# Patient Record
Sex: Female | Born: 1990 | Race: Black or African American | Hispanic: No | Marital: Single | Smoking: Current every day smoker
Health system: Southern US, Community
[De-identification: ages and names within clinical notes are randomized; demographics above are authoritative.]

## PROBLEM LIST (undated history)

## (undated) DIAGNOSIS — F315 Bipolar disorder, current episode depressed, severe, with psychotic features: Secondary | ICD-10-CM

## (undated) DIAGNOSIS — F209 Schizophrenia, unspecified: Secondary | ICD-10-CM

## (undated) DIAGNOSIS — E669 Obesity, unspecified: Secondary | ICD-10-CM

## (undated) DIAGNOSIS — J45909 Unspecified asthma, uncomplicated: Secondary | ICD-10-CM

## (undated) DIAGNOSIS — G4730 Sleep apnea, unspecified: Secondary | ICD-10-CM

## (undated) HISTORY — PX: TONSILLECTOMY: SUR1361

## (undated) HISTORY — PX: OTHER SURGICAL HISTORY: SHX169

---
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
## 2014-03-18 ENCOUNTER — Emergency Department (HOSPITAL_COMMUNITY): Payer: Self-pay

## 2014-03-18 ENCOUNTER — Emergency Department (HOSPITAL_COMMUNITY)
Admission: EM | Admit: 2014-03-18 | Discharge: 2014-03-18 | Disposition: A | Discharge status: Home / Self Care | Payer: Self-pay | Attending: Emergency Medicine | Admitting: Emergency Medicine

## 2014-03-18 ENCOUNTER — Encounter (HOSPITAL_COMMUNITY): Payer: Self-pay | Admitting: Emergency Medicine

## 2014-03-18 DIAGNOSIS — M25559 Pain in unspecified hip: Secondary | ICD-10-CM | POA: Insufficient documentation

## 2014-03-18 DIAGNOSIS — R1032 Left lower quadrant pain: Secondary | ICD-10-CM | POA: Insufficient documentation

## 2014-03-18 DIAGNOSIS — O039 Complete or unspecified spontaneous abortion without complication: Secondary | ICD-10-CM | POA: Insufficient documentation

## 2014-03-18 DIAGNOSIS — J45909 Unspecified asthma, uncomplicated: Secondary | ICD-10-CM | POA: Insufficient documentation

## 2014-03-18 HISTORY — DX: Unspecified asthma, uncomplicated: J45.909

## 2014-03-18 LAB — URINE MICROSCOPIC-ADD ON

## 2014-03-18 LAB — CBC WITH DIFFERENTIAL/PLATELET
BASOS PCT: 0 % (ref 0–1)
Basophils Absolute: 0 10*3/uL (ref 0.0–0.1)
EOS ABS: 0.2 10*3/uL (ref 0.0–0.7)
Eosinophils Relative: 2 % (ref 0–5)
HCT: 36.4 % (ref 36.0–46.0)
HEMOGLOBIN: 11.9 g/dL — AB (ref 12.0–15.0)
Lymphocytes Relative: 24 % (ref 12–46)
Lymphs Abs: 2.4 10*3/uL (ref 0.7–4.0)
MCH: 27.4 pg (ref 26.0–34.0)
MCHC: 32.7 g/dL (ref 30.0–36.0)
MCV: 83.7 fL (ref 78.0–100.0)
MONOS PCT: 6 % (ref 3–12)
Monocytes Absolute: 0.6 10*3/uL (ref 0.1–1.0)
NEUTROS ABS: 6.9 10*3/uL (ref 1.7–7.7)
NEUTROS PCT: 68 % (ref 43–77)
PLATELETS: 318 10*3/uL (ref 150–400)
RBC: 4.35 MIL/uL (ref 3.87–5.11)
RDW: 13.7 % (ref 11.5–15.5)
WBC: 10.2 10*3/uL (ref 4.0–10.5)

## 2014-03-18 LAB — URINALYSIS, ROUTINE W REFLEX MICROSCOPIC
Bilirubin Urine: NEGATIVE
GLUCOSE, UA: NEGATIVE mg/dL
KETONES UR: NEGATIVE mg/dL
Nitrite: NEGATIVE
Protein, ur: NEGATIVE mg/dL
Specific Gravity, Urine: 1.01 (ref 1.005–1.030)
Urobilinogen, UA: 1 mg/dL (ref 0.0–1.0)
pH: 7.5 (ref 5.0–8.0)

## 2014-03-18 LAB — BASIC METABOLIC PANEL
BUN: 4 mg/dL — AB (ref 6–23)
CHLORIDE: 103 meq/L (ref 96–112)
CO2: 25 mEq/L (ref 19–32)
Calcium: 9.1 mg/dL (ref 8.4–10.5)
Creatinine, Ser: 0.76 mg/dL (ref 0.50–1.10)
Glucose, Bld: 102 mg/dL — ABNORMAL HIGH (ref 70–99)
Potassium: 4.1 mEq/L (ref 3.7–5.3)
SODIUM: 139 meq/L (ref 137–147)

## 2014-03-18 LAB — POC URINE PREG, ED: PREG TEST UR: POSITIVE — AB

## 2014-03-18 LAB — HCG, QUANTITATIVE, PREGNANCY: hCG, Beta Chain, Quant, S: 3449 m[IU]/mL — ABNORMAL HIGH (ref <5)

## 2014-03-18 MED ORDER — IBUPROFEN 800 MG PO TABS: 800.0000 mg | ORAL_TABLET | Freq: Three times a day (TID) | ORAL | Status: DC

## 2014-03-18 MED ORDER — OXYCODONE-ACETAMINOPHEN 5-325 MG PO TABS
2.0000 | ORAL_TABLET | Freq: Once | ORAL | Status: AC
  Administered 2014-03-18: 2 via ORAL
  Filled 2014-03-18: qty 2

## 2014-03-18 NOTE — Discharge Instructions (Signed)
Miscarriage A miscarriage is the sudden loss of an unborn baby (fetus) before the 20th week of pregnancy. Most miscarriages happen in the first 3 months of pregnancy. Sometimes, it happens before a woman even knows she is pregnant. A miscarriage is also called a "spontaneous miscarriage" or "early pregnancy loss." Having a miscarriage can be an emotional experience. Talk with your caregiver about any questions you may have about miscarrying, the grieving process, and your future pregnancy plans. CAUSES   Problems with the fetal chromosomes that make it impossible for the baby to develop normally. Problems with the baby's genes or chromosomes are most often the result of errors that occur, by chance, as the embryo divides and grows. The problems are not inherited from the parents.  Infection of the cervix or uterus.   Hormone problems.   Problems with the cervix, such as having an incompetent cervix. This is when the tissue in the cervix is not strong enough to hold the pregnancy.   Problems with the uterus, such as an abnormally shaped uterus, uterine fibroids, or congenital abnormalities.   Certain medical conditions.   Smoking, drinking alcohol, or taking illegal drugs.   Trauma.  Often, the cause of a miscarriage is unknown.  SYMPTOMS   Vaginal bleeding or spotting, with or without cramps or pain.  Pain or cramping in the abdomen or lower back.  Passing fluid, tissue, or blood clots from the vagina. DIAGNOSIS  Your caregiver will perform a physical exam. You may also have an ultrasound to confirm the miscarriage. Blood or urine tests may also be ordered. TREATMENT   Sometimes, treatment is not necessary if you naturally pass all the fetal tissue that was in the uterus. If some of the fetus or placenta remains in the body (incomplete miscarriage), tissue left behind may become infected and must be removed. Usually, a dilation and curettage (D and C) procedure is performed.  During a D and C procedure, the cervix is widened (dilated) and any remaining fetal or placental tissue is gently removed from the uterus.  Antibiotic medicines are prescribed if there is an infection. Other medicines may be given to reduce the size of the uterus (contract) if there is a lot of bleeding.  If you have Rh negative blood and your baby was Rh positive, you will need a Rh immunoglobulin shot. This shot will protect any future baby from having Rh blood problems in future pregnancies. HOME CARE INSTRUCTIONS   Your caregiver may order bed rest or may allow you to continue light activity. Resume activity as directed by your caregiver.  Have someone help with home and family responsibilities during this time.   Keep track of the number of sanitary pads you use each day and how soaked (saturated) they are. Write down this information.   Do not use tampons. Do not douche or have sexual intercourse until approved by your caregiver.   Only take over-the-counter or prescription medicines for pain or discomfort as directed by your caregiver.   Do not take aspirin. Aspirin can cause bleeding.   Keep all follow-up appointments with your caregiver.   If you or your partner have problems with grieving, talk to your caregiver or seek counseling to help cope with the pregnancy loss. Allow enough time to grieve before trying to get pregnant again.  SEEK IMMEDIATE MEDICAL CARE IF:   You have severe cramps or pain in your back or abdomen.  You have a fever.  You pass large blood clots (walnut-sized   or larger) ortissue from your vagina. Save any tissue for your caregiver to inspect.   Your bleeding increases.   You have a thick, bad-smelling vaginal discharge.  You become lightheaded, weak, or you faint.   You have chills.  MAKE SURE YOU:  Understand these instructions.  Will watch your condition.  Will get help right away if you are not doing well or get  worse. Document Released: 05/14/2001 Document Revised: 03/15/2013 Document Reviewed: 01/07/2012 [employer] Patient Information 2014 [employer].  

## 2014-03-18 NOTE — ED Provider Notes (Signed)
Medical screening examination/treatment/procedure(s) were conducted as a shared visit with non-physician practitioner(s) and myself.  I personally evaluated the patient during the encounter.  Presenting with left lower quadrant abdominal pain/ flank pain and vaginal bleeding. She is to left flank and left lower quadrant abdominal tenderness on exam. Initial plan was CT scan to evaluate for possible kidney stone. Pregnancy test positive and CT scan canceled and ultrasound ordered to evaluate for ectopic. IUP seen on ultrasound without cardiac activity. Patient notified of ultrasound results. She agrees to outpatient followup. She states understanding all discharge and followup instructions. At time of discharge she denies any significant symptoms. OB/GYN referral provided.  Results for orders placed during the hospital encounter of 03/18/14  URINALYSIS, ROUTINE W REFLEX MICROSCOPIC      Result Value Ref Range   Color, Urine YELLOW  YELLOW   APPearance CLEAR  CLEAR   Specific Gravity, Urine 1.010  1.005 - 1.030   pH 7.5  5.0 - 8.0   Glucose, UA NEGATIVE  NEGATIVE mg/dL   Hgb urine dipstick TRACE (*) NEGATIVE   Bilirubin Urine NEGATIVE  NEGATIVE   Ketones, ur NEGATIVE  NEGATIVE mg/dL   Protein, ur NEGATIVE  NEGATIVE mg/dL   Urobilinogen, UA 1.0  0.0 - 1.0 mg/dL   Nitrite NEGATIVE  NEGATIVE   Leukocytes, UA SMALL (*) NEGATIVE  CBC WITH DIFFERENTIAL      Result Value Ref Range   WBC 10.2  4.0 - 10.5 K/uL   RBC 4.35  3.87 - 5.11 MIL/uL   Hemoglobin 11.9 (*) 12.0 - 15.0 g/dL   HCT 82.936.4  56.236.0 - 13.046.0 %   MCV 83.7  78.0 - 100.0 fL   MCH 27.4  26.0 - 34.0 pg   MCHC 32.7  30.0 - 36.0 g/dL   RDW 86.513.7  78.411.5 - 69.615.5 %   Platelets 318  150 - 400 K/uL   Neutrophils Relative % 68  43 - 77 %   Neutro Abs 6.9  1.7 - 7.7 K/uL   Lymphocytes Relative 24  12 - 46 %   Lymphs Abs 2.4  0.7 - 4.0 K/uL   Monocytes Relative 6  3 - 12 %   Monocytes Absolute 0.6  0.1 - 1.0 K/uL   Eosinophils Relative 2  0 - 5 %    Eosinophils Absolute 0.2  0.0 - 0.7 K/uL   Basophils Relative 0  0 - 1 %   Basophils Absolute 0.0  0.0 - 0.1 K/uL  BASIC METABOLIC PANEL      Result Value Ref Range   Sodium 139  137 - 147 mEq/L   Potassium 4.1  3.7 - 5.3 mEq/L   Chloride 103  96 - 112 mEq/L   CO2 25  19 - 32 mEq/L   Glucose, Bld 102 (*) 70 - 99 mg/dL   BUN 4 (*) 6 - 23 mg/dL   Creatinine, Ser 2.950.76  0.50 - 1.10 mg/dL   Calcium 9.1  8.4 - 28.410.5 mg/dL   GFR calc non Af Amer >90  >90 mL/min   GFR calc Af Amer >90  >90 mL/min  URINE MICROSCOPIC-ADD ON      Result Value Ref Range   Squamous Epithelial / LPF MANY (*) RARE   WBC, UA 7-10  <3 WBC/hpf   RBC / HPF 3-6  <3 RBC/hpf   Bacteria, UA FEW (*) RARE   Urine-Other TRICHOMONAS PRESENT    HCG, QUANTITATIVE, PREGNANCY      Result Value Ref Range  hCG, Beta Chain, Quant, S 3449 (*) <5 mIU/mL  POC URINE PREG, ED      Result Value Ref Range   Preg Test, Ur POSITIVE (*) NEGATIVE   [place] Ob Comp Less 14 Wks  03/18/2014   CLINICAL DATA:  Pelvic pain  EXAM: OBSTETRIC <14 WK US AND TRANSVAGINAL OB US  TECHNIQUE: Both transabdominal and transvaginal ultrasound examinations were performed for complete evaluation of the gestation as well as the maternal uterus, adnexal regions, and pelvic cul-de-sac. Transvaginal technique was performed to assess early pregnancy.  COMPARISON:  None.  FINDINGS: Intrauterine gestational sac: Present.  Yolk sac:  Absent.  Embryo:  Present.  Cardiac Activity: Absent.  Heart Rate:  0 bpm  CRL:   20.2  mm   8 w 4d                  US EDC: 10/24/2014  Maternal uterus/adnexae: The right ovary is normal. There is a hypoechoic left ovarian mass likely representing a corpus luteum cyst measuring 16 mm. There is a small amount of pelvic free fluid.  IMPRESSION: Intrauterine pregnancy without cardiac activity. Findings meet definitive criteria for failed pregnancy. This follows SRU consensus guidelines: Diagnostic Criteria for Nonviable Pregnancy Early in the First  Trimester. Macy Mis Engl J Med [phone];369:1443-51.   Electronically Signed   By: Elige KoHetal  Patel   On: 03/18/2014 04:36   [place] Ob Transvaginal  03/18/2014   CLINICAL DATA:  Pelvic pain  EXAM: OBSTETRIC <14 WK US AND TRANSVAGINAL OB US  TECHNIQUE: Both transabdominal and transvaginal ultrasound examinations were performed for complete evaluation of the gestation as well as the maternal uterus, adnexal regions, and pelvic cul-de-sac. Transvaginal technique was performed to assess early pregnancy.  COMPARISON:  None.  FINDINGS: Intrauterine gestational sac: Present.  Yolk sac:  Absent.  Embryo:  Present.  Cardiac Activity: Absent.  Heart Rate:  0 bpm  CRL:   20.2  mm   8 w 4d                  US EDC: 10/24/2014  Maternal uterus/adnexae: The right ovary is normal. There is a hypoechoic left ovarian mass likely representing a corpus luteum cyst measuring 16 mm. There is a small amount of pelvic free fluid.  IMPRESSION: Intrauterine pregnancy without cardiac activity. Findings meet definitive criteria for failed pregnancy. This follows SRU consensus guidelines: Diagnostic Criteria for Nonviable Pregnancy Early in the First Trimester. Macy Mis Engl J Med [phone]:[id number].   Electronically Signed   By: Elige KoHetal  Patel   On: 03/18/2014 04:36      Sunnie NielsenBrian Hektor Huston, MD 03/18/14 0530

## 2014-03-18 NOTE — ED Provider Notes (Signed)
CSN: [id number]     Arrival date & time 03/18/14  0028 History   First MD Initiated Contact with Patient 03/18/14 0040     Chief Complaint  Patient presents with  . Hip Pain     (Consider location/radiation/quality/duration/timing/severity/associated sxs/prior Treatment) Patient is a 23 y.o. female presenting with hip pain. The history is provided by the patient.  Hip Pain This is a new problem. The current episode started in the past 7 days. The problem occurs constantly. The problem has been gradually worsening. Pertinent negatives include no abdominal pain, chest pain, chills, coughing, fever, headaches, nausea, numbness or vomiting. The symptoms are aggravated by twisting and walking. She has tried nothing for the symptoms.   Jacqueline Fletcher is a 23 y.o. female who presents to the ED with left hip pain that started 2 days ago. She states that it hurts when she walks but it hurts worse when lying down. The pain comes and goes. She is sexually active. Last sexual intercourse 2 days ago. No birth control. Past Medical History  Diagnosis Date  . Asthma    Past Surgical History  Procedure Laterality Date  . Tonsillectomy    . C-section     No family history on file. History  Substance Use Topics  . Smoking status: Never Smoker   . Smokeless tobacco: Not on file  . Alcohol Use: No   OB History   Grav Para Term Preterm Abortions TAB SAB Ect Mult Living                 Review of Systems  Constitutional: Negative for fever and chills.  HENT: Negative.   Eyes: Negative for visual disturbance.  Respiratory: Negative for cough and shortness of breath.   Cardiovascular: Negative for chest pain.  Gastrointestinal: Negative for nausea, vomiting and abdominal pain.  Genitourinary: Positive for frequency, vaginal bleeding and pelvic pain. Negative for dysuria, urgency and vaginal discharge.  Musculoskeletal:       Left hip pain.  Skin: Negative for wound.  Neurological: Negative for  syncope, numbness and headaches.  Psychiatric/Behavioral: Negative for confusion. The patient is not nervous/anxious.    Negative except as stated in HPI   Allergies  Review of patient's allergies indicates no known allergies.  Home Medications   Prior to Admission medications   Not on File   BP 101/62  Pulse 96  Temp(Src) 98.1 F (36.7 C) (Oral)  Resp 16  Ht 5\' 2"  (1.575 m)  Wt 227 lb (102.967 kg)  BMI 41.51 kg/m2  SpO2 98%  LMP 03/17/2014 Physical Exam  Nursing note and vitals reviewed. Constitutional: She is oriented to person, place, and time. She appears well-developed and well-nourished.  HENT:  Head: Normocephalic and atraumatic.  Eyes: EOM are normal.  Neck: Neck supple.  Cardiovascular: Normal rate and regular rhythm.   Pulmonary/Chest: Effort normal and breath sounds normal.  Abdominal: Soft. Bowel sounds are normal. There is tenderness in the left lower quadrant. There is no rebound and no guarding.  Genitourinary:  External genitalia without lesions. Small blood vaginal vault. Cervix closed, positive CMT, left adnexal tenderness. Unable to palpate uterus due to patient habitus.   Musculoskeletal: Normal range of motion.  Tender left flank   Neurological: She is alert and oriented to person, place, and time. No cranial nerve deficit.  Skin: Skin is warm and dry.  Psychiatric: She has a normal mood and affect. Her behavior is normal.    ED Course  Procedures   MDM  Patient awaiting lab results. Care turned over to Dr. Dierdre Highmanpitz at 0150 am     Jacqueline NapoleonHope M Reynalda Canny, NP 03/18/14 [phone]

## 2014-03-18 NOTE — ED Notes (Signed)
Pt reports pain to left hip area x 2 days, denies injury to same.

## 2014-09-05 ENCOUNTER — Emergency Department: Payer: Self-pay | Admitting: Emergency Medicine

## 2017-06-06 ENCOUNTER — Emergency Department
Admission: EM | Admit: 2017-06-06 | Discharge: 2017-06-06 | Disposition: A | Discharge status: Home / Self Care | Payer: Medicaid Other | Attending: Emergency Medicine | Admitting: Emergency Medicine

## 2017-06-06 ENCOUNTER — Emergency Department: Payer: Medicaid Other

## 2017-06-06 DIAGNOSIS — M25532 Pain in left wrist: Secondary | ICD-10-CM | POA: Insufficient documentation

## 2017-06-06 DIAGNOSIS — Z791 Long term (current) use of non-steroidal anti-inflammatories (NSAID): Secondary | ICD-10-CM | POA: Insufficient documentation

## 2017-06-06 DIAGNOSIS — J45909 Unspecified asthma, uncomplicated: Secondary | ICD-10-CM | POA: Insufficient documentation

## 2017-06-06 MED ORDER — MELOXICAM 7.5 MG PO TABS: 7.5000 mg | ORAL_TABLET | Freq: Every day | ORAL | 1 refills | Status: AC

## 2017-06-06 NOTE — ED Triage Notes (Signed)
Pt reports left wrist injury after jumping on trampoline yesterday, swelling noted to left hand.

## 2017-06-06 NOTE — ED Provider Notes (Signed)
[facility] Emergency Department Provider Note  ____________________________________________  Time seen: Approximately 5:01 PM  I have reviewed the triage vital signs and the nursing notes.   HISTORY  Chief Complaint Wrist Pain    HPI Jacqueline Fletcher is a 26 y.o. female presents to the emergency department with 7 out of 10 left wrist pain after patient was jumping on a trampoline yesterday. Patient did not hit her head during the incident. She denies weakness, radiculopathy or changes in sensation of the left upper extremity. She denies prior surgeries to the left upper extremity. No alleviating measures have been undertaken.   Past Medical History:  Diagnosis Date  . Asthma     There are no active problems to display for this patient.   Past Surgical History:  Procedure Laterality Date  . c-section    . TONSILLECTOMY      Prior to Admission medications   Medication Sig Start Date End Date Taking? Authorizing Provider  ibuprofen (ADVIL,MOTRIN) 800 MG tablet Take 1 tablet (800 mg total) by mouth 3 (three) times daily. 03/18/14   Sunnie Nielsenpitz, Brian, MD  meloxicam (MOBIC) 7.5 MG tablet Take 1 tablet (7.5 mg total) by mouth daily. 06/06/17 06/13/17  Orvil FeilWoods, Neftaly Inzunza M, PA-C    Allergies Patient has no known allergies.  No family history on file.  Social History Social History  Substance Use Topics  . Smoking status: Never Smoker  . Smokeless tobacco: Not on file  . Alcohol use No     Review of Systems  Constitutional: No fever/chills Eyes: No visual changes. No discharge ENT: No upper respiratory complaints. Cardiovascular: no chest pain. Respiratory: no cough. No SOB. Gastrointestinal: No abdominal pain.  No nausea, no vomiting.  No diarrhea.  No constipation. Musculoskeletal: Patient has left wrist pain.  Skin: Negative for rash, abrasions, lacerations, ecchymosis. Neurological: Negative for headaches, focal weakness or  numbness.   ____________________________________________   PHYSICAL EXAM:  VITAL SIGNS: ED Triage Vitals  Enc Vitals Group     BP 06/06/17 1626 118/64     Pulse Rate 06/06/17 1626 86     Resp 06/06/17 1626 14     Temp 06/06/17 1626 97.7 F (36.5 C)     Temp Source 06/06/17 1626 Oral     SpO2 06/06/17 1626 100 %     Weight 06/06/17 1629 160 lb (72.6 kg)     Height 06/06/17 1629 5\' 2"  (1.575 m)     Head Circumference --      Peak Flow --      Pain Score 06/06/17 1625 7     Pain Loc --      Pain Edu? --      Excl. in GC? --      Constitutional: Alert and oriented. Well appearing and in no acute distress. Eyes: Conjunctivae are normal. PERRL. EOMI. Head: Atraumatic. Cardiovascular: Normal rate, regular rhythm. Normal S1 and S2.  Good peripheral circulation. Respiratory: Normal respiratory effort without tachypnea or retractions. Lungs CTAB. Good air entry to the bases with no decreased or absent breath sounds. Musculoskeletal: Patient has 5 out of 5 strength in the upper extremities bilaterally. Patient is able to perform full range of motion at the left wrist and elbow. She is able to move all 5 left fingers. Palpable radial and ulnar pulses bilaterally and symmetrically. Neurologic:  Normal speech and language. No gross focal neurologic deficits are appreciated.  Skin:  Skin is warm, dry and intact. No rash noted. Psychiatric: Mood and  affect are normal. Speech and behavior are normal. Patient exhibits appropriate insight and judgement.   ____________________________________________   LABS (all labs ordered are listed, but only abnormal results are displayed)  Labs Reviewed - No data to display ____________________________________________  EKG   ____________________________________________  RADIOLOGY Geraldo Pitter, personally viewed and evaluated these images (plain radiographs) as part of my medical decision making, as well as reviewing the written report by  the radiologist.  Dg Wrist Complete Left  Result Date: 06/06/2017 CLINICAL DATA:  Pain following injury on trampoline EXAM: LEFT WRIST - COMPLETE 3+ VIEW COMPARISON:  None. FINDINGS: Frontal, oblique, lateral, and ulnar deviation scaphoid images were obtained. No fracture or dislocation. Joint spaces appear normal. No erosive change. IMPRESSION: No fracture or dislocation.  No appreciable arthropathy. Electronically Signed   By: Bretta Bang III M.D.   On: 06/06/2017 16:47    ____________________________________________    PROCEDURES  Procedure(s) performed:    Procedures    Medications - No data to display   ____________________________________________   INITIAL IMPRESSION / ASSESSMENT AND PLAN / ED COURSE  Pertinent labs & imaging results that were available during my care of the patient were reviewed by me and considered in my medical decision making (see chart for details).  Review of the [place] CSRS was performed in accordance of the [facility] prior to dispensing any controlled drugs.    Assessment and plan: Left wrist pain: Patient presents to the emergency department with left wrist pain after she was jumping on the trampoline. X-ray Examination conducted in the emergency department reveals no acute fractures or bony abnormalities. Physical exam was reassuring. A wrist splint was applied in the emergency department. Patient was advised to follow up with orthopedics as needed, Dr. Hyacinth Meeker. Vital signs are reassuring prior to discharge. All patient questions were answered.   ____________________________________________  FINAL CLINICAL IMPRESSION(S) / ED DIAGNOSES  Final diagnoses:  Left wrist pain      NEW MEDICATIONS STARTED DURING THIS VISIT:  New Prescriptions   MELOXICAM (MOBIC) 7.5 MG TABLET    Take 1 tablet (7.5 mg total) by mouth daily.        This chart was dictated using voice recognition software/Dragon. Despite best efforts to proofread, errors can  occur which can change the meaning. Any change was purely unintentional.    Orvil Feil, PA-C 06/06/17 1911    Sharman Cheek, MD 06/09/17 [phone]

## 2017-06-06 NOTE — ED Notes (Signed)
See triage note. States she was jumping on trampoline and her son's knee came down on her posterior left wrist  Min swelling noted  With positive pulses

## 2017-07-06 ENCOUNTER — Emergency Department
Admission: EM | Admit: 2017-07-06 | Discharge: 2017-07-06 | Disposition: A | Discharge status: Other Acute Inpatient Hospital | Payer: Medicaid Other | Attending: Emergency Medicine | Admitting: Emergency Medicine

## 2017-07-06 ENCOUNTER — Inpatient Hospital Stay
Admission: AD | Admit: 2017-07-06 | Discharge: 2017-07-10 | DRG: 885 | Disposition: A | Discharge status: Home / Self Care | Payer: Medicaid Other | Attending: Psychiatry | Admitting: Psychiatry

## 2017-07-06 DIAGNOSIS — Z590 Homelessness: Secondary | ICD-10-CM | POA: Diagnosis not present

## 2017-07-06 DIAGNOSIS — I252 Old myocardial infarction: Secondary | ICD-10-CM | POA: Diagnosis not present

## 2017-07-06 DIAGNOSIS — J45909 Unspecified asthma, uncomplicated: Secondary | ICD-10-CM | POA: Insufficient documentation

## 2017-07-06 DIAGNOSIS — F329 Major depressive disorder, single episode, unspecified: Secondary | ICD-10-CM | POA: Diagnosis present

## 2017-07-06 DIAGNOSIS — R45851 Suicidal ideations: Secondary | ICD-10-CM | POA: Insufficient documentation

## 2017-07-06 DIAGNOSIS — F419 Anxiety disorder, unspecified: Secondary | ICD-10-CM | POA: Diagnosis present

## 2017-07-06 DIAGNOSIS — Z818 Family history of other mental and behavioral disorders: Secondary | ICD-10-CM | POA: Diagnosis not present

## 2017-07-06 DIAGNOSIS — R456 Violent behavior: Secondary | ICD-10-CM | POA: Diagnosis present

## 2017-07-06 DIAGNOSIS — F429 Obsessive-compulsive disorder, unspecified: Secondary | ICD-10-CM | POA: Diagnosis present

## 2017-07-06 DIAGNOSIS — F315 Bipolar disorder, current episode depressed, severe, with psychotic features: Secondary | ICD-10-CM | POA: Diagnosis present

## 2017-07-06 DIAGNOSIS — G4730 Sleep apnea, unspecified: Secondary | ICD-10-CM | POA: Diagnosis present

## 2017-07-06 DIAGNOSIS — F319 Bipolar disorder, unspecified: Secondary | ICD-10-CM | POA: Diagnosis present

## 2017-07-06 DIAGNOSIS — Z8673 Personal history of transient ischemic attack (TIA), and cerebral infarction without residual deficits: Secondary | ICD-10-CM

## 2017-07-06 DIAGNOSIS — Z791 Long term (current) use of non-steroidal anti-inflammatories (NSAID): Secondary | ICD-10-CM | POA: Diagnosis not present

## 2017-07-06 DIAGNOSIS — F32A Depression, unspecified: Secondary | ICD-10-CM

## 2017-07-06 DIAGNOSIS — G4700 Insomnia, unspecified: Secondary | ICD-10-CM | POA: Diagnosis present

## 2017-07-06 HISTORY — DX: Sleep apnea, unspecified: G47.30

## 2017-07-06 HISTORY — DX: Schizophrenia, unspecified: F20.9

## 2017-07-06 LAB — CBC
HCT: 38.4 % (ref 35.0–47.0)
HEMOGLOBIN: 12.8 g/dL (ref 12.0–16.0)
MCH: 27.4 pg (ref 26.0–34.0)
MCHC: 33.4 g/dL (ref 32.0–36.0)
MCV: 82.1 fL (ref 80.0–100.0)
Platelets: 294 10*3/uL (ref 150–440)
RBC: 4.68 MIL/uL (ref 3.80–5.20)
RDW: 14.5 % (ref 11.5–14.5)
WBC: 9.6 10*3/uL (ref 3.6–11.0)

## 2017-07-06 LAB — COMPREHENSIVE METABOLIC PANEL
ALT: 13 U/L — AB (ref 14–54)
AST: 19 U/L (ref 15–41)
Albumin: 4.2 g/dL (ref 3.5–5.0)
Alkaline Phosphatase: 55 U/L (ref 38–126)
Anion gap: 7 (ref 5–15)
BUN: 9 mg/dL (ref 6–20)
CALCIUM: 9 mg/dL (ref 8.9–10.3)
CHLORIDE: 105 mmol/L (ref 101–111)
CO2: 26 mmol/L (ref 22–32)
CREATININE: 0.79 mg/dL (ref 0.44–1.00)
GFR calc Af Amer: >60 mL/min (ref >60)
GFR calc non Af Amer: >60 mL/min (ref >60)
Glucose, Bld: 96 mg/dL (ref 65–99)
Potassium: 3.8 mmol/L (ref 3.5–5.1)
SODIUM: 138 mmol/L (ref 135–145)
Total Bilirubin: 0.4 mg/dL (ref 0.3–1.2)
Total Protein: 7.8 g/dL (ref 6.5–8.1)

## 2017-07-06 LAB — POCT PREGNANCY, URINE: Preg Test, Ur: NEGATIVE

## 2017-07-06 LAB — URINE DRUG SCREEN, QUALITATIVE (ARMC ONLY)
AMPHETAMINES, UR SCREEN: NOT DETECTED
BENZODIAZEPINE, UR SCRN: NOT DETECTED
Barbiturates, Ur Screen: NOT DETECTED
COCAINE METABOLITE, UR ~~LOC~~: NOT DETECTED
Cannabinoid 50 Ng, Ur ~~LOC~~: NOT DETECTED
MDMA (ECSTASY) UR SCREEN: NOT DETECTED
METHADONE SCREEN, URINE: NOT DETECTED
Opiate, Ur Screen: NOT DETECTED
Phencyclidine (PCP) Ur S: NOT DETECTED
TRICYCLIC, UR SCREEN: NOT DETECTED

## 2017-07-06 LAB — ACETAMINOPHEN LEVEL

## 2017-07-06 LAB — SALICYLATE LEVEL

## 2017-07-06 LAB — ETHANOL: Alcohol, Ethyl (B): <5 mg/dL (ref <5)

## 2017-07-06 MED ORDER — ZIPRASIDONE HCL 20 MG PO CAPS
20.0000 mg | ORAL_CAPSULE | Freq: Two times a day (BID) | ORAL | Status: DC
  Administered 2017-07-06: 20 mg via ORAL
  Filled 2017-07-06: qty 1
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
## 2017-07-06 NOTE — ED Triage Notes (Signed)
Patient reports having issues with depression.  Patient reports she has been living with a lady and she wouldn't give her her medications.

## 2017-07-06 NOTE — ED Notes (Signed)
PT IS [facility] AND PENDING PLACEMENT  

## 2017-07-06 NOTE — ED Notes (Signed)
Dwight from [employer] called and said that he was going to call supervisor at [employer] to have them contact me since I had not heard from anyone at this time.
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
## 2017-07-06 NOTE — ED Notes (Signed)
Pt given coloring sheets and crayons as requested. No needs or concerns at this time. Maintained on 15 minute checks and observation by security camera for safety.

## 2017-07-06 NOTE — ED Notes (Signed)
Patient resting quietly in room. No noted distress or abnormal behaviors noted. Will continue 15 minute checks and observation by security camera for safety. 
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
## 2017-07-06 NOTE — ED Notes (Signed)
Pt calm, cooperative. Pt states, "I got out of the shower and the cops were there."  Pt denies having a knife in her possession. Pt stated she was having SI, but currently denies. Denies HI and AVH. Denies pain.  Pt informed she would be speaking to a psychiatrist. Pt accepting. Maintained on 15 minute checks and observation by security camera for safety.

## 2017-07-06 NOTE — ED Notes (Signed)
Patient is eating her breakfast at this time.

## 2017-07-07 DIAGNOSIS — F315 Bipolar disorder, current episode depressed, severe, with psychotic features: Principal | ICD-10-CM

## 2017-07-07 LAB — LIPID PANEL
CHOLESTEROL: 130 mg/dL (ref 0–200)
HDL: 55 mg/dL (ref >40)
LDL Cholesterol: 67 mg/dL (ref 0–99)
Total CHOL/HDL Ratio: 2.4 RATIO
Triglycerides: 40 mg/dL (ref <150)
VLDL: 8 mg/dL (ref 0–40)

## 2017-07-07 LAB — TSH: TSH: 1.898 u[IU]/mL (ref 0.350–4.500)

## 2017-07-07 MED ORDER — FLUVOXAMINE MALEATE 50 MG PO TABS
50.0000 mg | ORAL_TABLET | Freq: Every day | ORAL | Status: DC
  Administered 2017-07-07 – 2017-07-08 (×2): 50 mg via ORAL
  Filled 2017-07-07 (×2): qty 1

## 2017-07-07 MED ORDER — ALUM & MAG HYDROXIDE-SIMETH 200-200-20 MG/5ML PO SUSP: 30.0000 mL | ORAL | Status: DC | PRN

## 2017-07-07 MED ORDER — ACETAMINOPHEN 325 MG PO TABS: 650.0000 mg | ORAL_TABLET | Freq: Four times a day (QID) | ORAL | Status: DC | PRN

## 2017-07-07 MED ORDER — IBUPROFEN 800 MG PO TABS: 800.0000 mg | ORAL_TABLET | Freq: Three times a day (TID) | ORAL | Status: DC | PRN

## 2017-07-07 MED ORDER — ZIPRASIDONE HCL 40 MG PO CAPS: 40.0000 mg | ORAL_CAPSULE | Freq: Two times a day (BID) | ORAL | Status: DC

## 2017-07-07 MED ORDER — MAGNESIUM HYDROXIDE 400 MG/5ML PO SUSP: 30.0000 mL | Freq: Every day | ORAL | Status: DC | PRN

## 2017-07-07 MED ORDER — TRAZODONE HCL 50 MG PO TABS: 50.0000 mg | ORAL_TABLET | Freq: Every evening | ORAL | Status: DC | PRN

## 2017-07-07 MED ORDER — HYDROXYZINE HCL 25 MG PO TABS
25.0000 mg | ORAL_TABLET | Freq: Four times a day (QID) | ORAL | Status: DC | PRN
  Administered 2017-07-07: 25 mg via ORAL
  Filled 2017-07-07: qty 1

## 2017-07-07 MED ORDER — ZIPRASIDONE HCL 20 MG PO CAPS
20.0000 mg | ORAL_CAPSULE | Freq: Two times a day (BID) | ORAL | Status: DC
  Administered 2017-07-07: 20 mg via ORAL
  Filled 2017-07-07: qty 1

## 2017-07-07 MED ORDER — RISPERIDONE 1 MG PO TABS
2.0000 mg | ORAL_TABLET | Freq: Every day | ORAL | Status: DC
  Administered 2017-07-07 – 2017-07-08 (×2): 2 mg via ORAL
  Filled 2017-07-07 (×2): qty 2

## 2017-07-07 MED ORDER — RISPERIDONE 1 MG PO TABS
2.0000 mg | ORAL_TABLET | Freq: Two times a day (BID) | ORAL | Status: DC
  Administered 2017-07-07 – 2017-07-10 (×7): 2 mg via ORAL
  Filled 2017-07-07 (×7): qty 2

## 2017-07-07 MED ORDER — TRAZODONE HCL 100 MG PO TABS
100.0000 mg | ORAL_TABLET | Freq: Every day | ORAL | Status: DC
  Administered 2017-07-07 – 2017-07-09 (×3): 100 mg via ORAL
  Filled 2017-07-07 (×3): qty 1

## 2017-07-07 NOTE — [facility] Group Notes (Signed)
[facility] LCSW Group Therapy Note  Date/Time: 07/07/17, 0930  Type of Therapy and Topic:  Group Therapy:  Overcoming Obstacles  Participation Level:  In and out.  Description of Group:    In this group patients will be encouraged to explore what they see as obstacles to their own wellness and recovery. They will be guided to discuss their thoughts, feelings, and behaviors related to these obstacles. The group will process together ways to cope with barriers, with attention given to specific choices patients can make. Each patient will be challenged to identify changes they are motivated to make in order to overcome their obstacles. This group will be process-oriented, with patients participating in exploration of their own experiences as well as giving and receiving support and challenge from other group members.  Therapeutic Goals: 1. Patient will identify personal and current obstacles as they relate to admission. 2. Patient will identify barriers that currently interfere with their wellness or overcoming obstacles.  3. Patient will identify feelings, thought process and behaviors related to these barriers. 4. Patient will identify two changes they are willing to make to overcome these obstacles:    Summary of Patient Progress: Pt participated in group but got up and left 2 different times and was gone for a period of time.  She described the situation that led to her admission and did say that she is changing her living situation in response to this incident.  She was not able to share a longer term obstacle in her life.       Therapeutic Modalities:   Cognitive Behavioral Therapy Solution Focused Therapy Motivational Interviewing Relapse Prevention Therapy  Daleen SquibbGreg Yanilen Adamik, LCSW

## 2017-07-07 NOTE — Tx Team (Signed)
Interdisciplinary Treatment and Diagnostic Plan Update  07/07/2017 Time of Session: 10:30am Jacqueline Fletcher MRN: [id number]  Principal Diagnosis: Bipolar I disorder, current or most recent episode depressed, with psychotic features (HCC)  Secondary Diagnoses: Principal Problem:   Bipolar I disorder, current or most recent episode depressed, with psychotic features (HCC)   Current Medications:  Current Facility-Administered Medications  Medication Dose Route Frequency Provider Last Rate Last Dose  . acetaminophen (TYLENOL) tablet 650 mg  650 mg Oral Q6H PRN Beverly SessionsSubedi, Jagannath, MD      . alum & mag hydroxide-simeth (MAALOX/MYLANTA) 200-200-20 MG/5ML suspension 30 mL  30 mL Oral Q4H PRN Beverly SessionsSubedi, Jagannath, MD      . hydrOXYzine (ATARAX/VISTARIL) tablet 25 mg  25 mg Oral Q6H PRN Beverly SessionsSubedi, Jagannath, MD      . ibuprofen (ADVIL,MOTRIN) tablet 800 mg  800 mg Oral TID PRN Beverly SessionsSubedi, Jagannath, MD      . magnesium hydroxide (MILK OF MAGNESIA) suspension 30 mL  30 mL Oral Daily PRN Beverly SessionsSubedi, Jagannath, MD      . traZODone (DESYREL) tablet 50 mg  50 mg Oral QHS PRN Beverly SessionsSubedi, Jagannath, MD      . ziprasidone (GEODON) capsule 20 mg  20 mg Oral BID WC Beverly SessionsSubedi, Jagannath, MD   20 mg at 07/07/17 [id number]   PTA Medications: Prescriptions Prior to Admission  Medication Sig Dispense Refill Last Dose  . ibuprofen (ADVIL,MOTRIN) 800 MG tablet Take 1 tablet (800 mg total) by mouth 3 (three) times daily. (Patient not taking: Reported on 07/07/2017) 21 tablet 0     Patient Stressors: Marital or family conflict  Patient Strengths: Capable of independent living Supportive family/friends  Treatment Modalities: Medication Management, Group therapy, Case management,  1 to 1 session with clinician, Psychoeducation, Recreational therapy.   Physician Treatment Plan for Primary Diagnosis: Bipolar I disorder, current or most recent episode depressed, with psychotic features (HCC) Long Term Goal(s): Improvement in symptoms so as  ready for discharge NA   Short Term Goals: Ability to identify changes in lifestyle to reduce recurrence of condition will improve Ability to verbalize feelings will improve Ability to disclose and discuss suicidal ideas Ability to demonstrate self-control will improve Ability to identify and develop effective coping behaviors will improve Ability to maintain clinical measurements within normal limits will improve Compliance with prescribed medications will improve Ability to identify triggers associated with substance abuse/mental health issues will improve NA  Medication Management: Evaluate patient's response, side effects, and tolerance of medication regimen.  Therapeutic Interventions: 1 to 1 sessions, Unit Group sessions and Medication administration.  Evaluation of Outcomes: Progressing  Physician Treatment Plan for Secondary Diagnosis: Principal Problem:   Bipolar I disorder, current or most recent episode depressed, with psychotic features (HCC)  Long Term Goal(s): Improvement in symptoms so as ready for discharge NA   Short Term Goals: Ability to identify changes in lifestyle to reduce recurrence of condition will improve Ability to verbalize feelings will improve Ability to disclose and discuss suicidal ideas Ability to demonstrate self-control will improve Ability to identify and develop effective coping behaviors will improve Ability to maintain clinical measurements within normal limits will improve Compliance with prescribed medications will improve Ability to identify triggers associated with substance abuse/mental health issues will improve NA     Medication Management: Evaluate patient's response, side effects, and tolerance of medication regimen.  Therapeutic Interventions: 1 to 1 sessions, Unit Group sessions and Medication administration.  Evaluation of Outcomes: Progressing   RN Treatment Plan for Primary Diagnosis: Bipolar  I disorder, current or most  recent episode depressed, with psychotic features (HCC) Long Term Goal(s): Knowledge of disease and therapeutic regimen to maintain health will improve  Short Term Goals: Ability to remain free from injury will improve, Ability to demonstrate self-control, Ability to disclose and discuss suicidal ideas and Compliance with prescribed medications will improve  Medication Management: RN will administer medications as ordered by provider, will assess and evaluate patient's response and provide education to patient for prescribed medication. RN will report any adverse and/or side effects to prescribing provider.  Therapeutic Interventions: 1 on 1 counseling sessions, Psychoeducation, Medication administration, Evaluate responses to treatment, Monitor vital signs and CBGs as ordered, Perform/monitor CIWA, COWS, AIMS and Fall Risk screenings as ordered, Perform wound care treatments as ordered.  Evaluation of Outcomes: Progressing   LCSW Treatment Plan for Primary Diagnosis: Bipolar I disorder, current or most recent episode depressed, with psychotic features (HCC) Long Term Goal(s): Safe transition to appropriate next level of care at discharge, Engage patient in therapeutic group addressing interpersonal concerns.  Short Term Goals: Engage patient in aftercare planning with referrals and resources, Increase emotional regulation, Facilitate acceptance of mental health diagnosis and concerns, Identify triggers associated with mental health/substance abuse issues and Increase skills for wellness and recovery  Therapeutic Interventions: Assess for all discharge needs, 1 to 1 time with Social worker, Explore available resources and support systems, Assess for adequacy in community support network, Educate family and significant other(s) on suicide prevention, Complete Psychosocial Assessment, Interpersonal group therapy.  Evaluation of Outcomes: Progressing   Progress in Treatment: Attending groups:  Yes. Participating in groups: Yes. Taking medication as prescribed: Yes. Toleration medication: Yes. Family/Significant other contact made: Yes, individual(s) contacted:  mother Patient understands diagnosis: Yes. Discussing patient identified problems/goals with staff: Yes. Medical problems stabilized or resolved: Yes. Denies suicidal/homicidal ideation: Yes. Issues/concerns per patient self-inventory: No. Other:    New problem(s) identified: No, Describe:     New Short Term/Long Term Goal(s):  Discharge Plan or Barriers: Discharge home with mom follow up with [facility], CPS case open  Reason for Continuation of Hospitalization: Anxiety Depression Medication stabilization Suicidal ideation  Estimated Length of Stay:1-2 days  Attendees: Patient:Jacqueline Fletcher 07/07/2017 4:02 PM  Physician: Kristine Linea 07/07/2017 4:02 PM  Nursing: Leonia Reader RN 07/07/2017 4:02 PM  RN Care Manager: 07/07/2017 4:02 PM  Social Worker: Jake Shark, LCSW 07/07/2017 4:02 PM  Recreational Therapist: Hershal Coria, LRT 07/07/2017 4:02 PM  Other:  07/07/2017 4:02 PM  Other:  07/07/2017 4:02 PM  Other: 07/07/2017 4:02 PM    Scribe for Treatment Team: Glennon Mac, LCSW 07/07/2017 4:02 PM
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
## 2017-07-07 NOTE — Plan of Care (Signed)
Problem: Coping: Goal: Ability to verbalize feelings will improve Outcome: Progressing Verbalizes feelings without any difficulty.     

## 2017-07-07 NOTE — Progress Notes (Signed)
Recreation Therapy Notes  Date: 08.06.18 Time: 1:00 pm Location: Craft Room  Group Topic: Wellness  Goal Area(s) Addresses:  Patient will identify at least one item per dimension of health. Patient will examine areas they are deficient in.  Behavioral Response: Attentive, Interactive  Intervention: 6 Dimensions of Health  Activity: Patients were given a definition sheet defining each dimension of health. Patients were given a worksheet with each dimension listed and were instructed to write items they were currently doing in each dimension.  Education: LRT educated patients on ways to improve each dimension.  Education Outcome: Acknowledges education/In group clarification offered  Clinical Observations/Feedback: Patient wrote at least one item in each dimension. Patient contributed to group discussion by stating what areas she was giving enough attention to, what areas she was not giving enough attention to, and how she could improve certain dimensions.  Jacquelynn CreeGreene,Guerino Caporale M, LRT/CTRS 07/07/2017 2:06 PM

## 2017-07-07 NOTE — Progress Notes (Signed)
Initial visit with patient. CH explained the programs offered and services from [facility]. Patient appeared upbeat and was smiling. CH will follow-up with patient later today. Patient stated she needed nothing at the moment and would be in group if she is still here.   07/07/17 0955  Clinical Encounter Type  Visited With Patient  Visit Type Initial;Spiritual support;Behavioral Health  Referral From Patient

## 2017-07-07 NOTE — Plan of Care (Signed)
Problem: Coping: Goal: Ability to cope will improve Outcome: Progressing Pt will be able to use coping skills in 7 days.
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
## 2017-07-07 NOTE — [facility] Suicide Risk Assessment (Signed)
[facility] Admission Suicide Risk Assessment   Nursing information obtained from:    Demographic factors:    Current Mental Status:    Loss Factors:    Historical Factors:    Risk Reduction Factors:     Total Time spent with patient: 1 hour Principal Problem: Bipolar I disorder, current or most recent episode depressed, with psychotic features [facility] [place]) Diagnosis:   Patient Active Problem List   Diagnosis Date Noted  . Bipolar I disorder, current or most recent episode depressed, with psychotic features ([place]) [F31.5] 07/07/2017   Subjective Data: psychotic break.  Continued Clinical Symptoms:  Alcohol Use Disorder Identification Test Final Score (AUDIT): 3 The "Alcohol Use Disorders Identification Test", Guidelines for Use in Primary Care, Second Edition.  World Pharmacologist [facility]). Score between 0-7:  no or low risk or alcohol related problems. Score between 8-15:  moderate risk of alcohol related problems. Score between 16-19:  high risk of alcohol related problems. Score 20 or above:  warrants further diagnostic evaluation for alcohol dependence and treatment.   CLINICAL FACTORS:   Bipolar Disorder:   Depressive phase Depression:   Impulsivity Medical Diagnoses and Treatments/Surgeries   Musculoskeletal: Strength & Muscle Tone: within normal limits Gait & Station: normal Patient leans: N/A  Psychiatric Specialty Exam: Physical Exam  Nursing note and vitals reviewed. Psychiatric: Her affect is blunt. Her speech is delayed. She is slowed and withdrawn. Cognition and memory are impaired. She expresses impulsivity. She exhibits a depressed mood. She expresses suicidal ideation. She expresses suicidal plans.    Review of Systems  Psychiatric/Behavioral: Positive for depression, hallucinations and suicidal ideas. The patient is nervous/anxious.   All other systems reviewed and are negative.   Blood pressure (!) 104/56, pulse 91, temperature 98.3 F (36.8 C), resp. rate 16,  height _0  (1.6 m), weight 92.5 kg (204 lb), last menstrual period 06/16/2017, SpO2 100 %.Body mass index is 36.14 kg/m.  General Appearance: Casual  Eye Contact:  Good  Speech:  Clear and Coherent  Volume:  Decreased  Mood:  Depressed  Affect:  Blunt  Thought Process:  Goal Directed and Descriptions of Associations: Intact  Orientation:  Full (Time, Place, and Person)  Thought Content:  Hallucinations: Auditory Command:  to kill herself.  Suicidal Thoughts:  Yes.  without intent/plan  Homicidal Thoughts:  No  Memory:  Immediate;   Fair Recent;   Fair Remote;   Fair  Judgement:  Poor  Insight:  Lacking  Psychomotor Activity:  Psychomotor Retardation  Concentration:  Concentration: Fair and Attention Span: Fair  Recall:  [employer] of Knowledge:  Fair  Language:  Fair  Akathisia:  No  Handed:  Right  AIMS (if indicated):     Assets:  Communication Skills Desire for Improvement Financial Resources/Insurance Housing Resilience Social Support  ADL's:  Intact  Cognition:  WNL  Sleep:  Number of Hours: 4.5      COGNITIVE FEATURES THAT CONTRIBUTE TO RISK:  None    SUICIDE RISK:   Moderate:  Frequent suicidal ideation with limited intensity, and duration, some specificity in terms of plans, no associated intent, good self-control, limited dysphoria/symptomatology, some risk factors present, and identifiable protective factors, including available and accessible social support.  PLAN OF CARE: Began admission, medication management, discharge planning.  Ms. Daughety is a 26 year old female with a history of psychotic depression admitted for suicidal thinking and threatening behavior.  1. Suicidal or homicidal ideation. The patient adamantly denies any homicidal thinking and is able to contract  for safety in the hospital.  2. Mood. She was started on Geodon.  3. Metabolic syndrome monitoring. Lipid panel, hemoglobin A1c and TSH are pending.  4. EKG. Pending.  5.  Insomnia. Trazodone is available.  6. Anxiety. Vistaril is available.  7. Social. The patient met with CPS worker.  8. Disposition. She will likely be discharged with family. She will follow up with [facility].   I certify that inpatient services furnished can reasonably be expected to improve the patient's condition.   Orson Slick, MD 07/07/2017, 12:55 PM

## 2017-07-07 NOTE — Progress Notes (Signed)
Patient is pleasant and cooperative.  Denies SI at this time although states that she was having suicidal thoughts prior to coming in.  Further states that she is here because the lady she lives with lied on her.  CPS was here to interview patient earlier today.  Patient has been visible in the milieu.  Interacting appropriately with peers and staff.  Medication and group compliant.  Support offered.  Safety maintained.

## 2017-07-07 NOTE — [facility] Suicide Risk Assessment (Signed)
[facility] INPATIENT:  Family/Significant Other Suicide Prevention Education  Suicide Prevention Education:  Contact Attempts: Jacqueline GullyMary Fletcher, Mother, [phone] and [phone], (name of family member/significant other) has been identified by the patient as the family member/significant other with whom the patient will be residing, and identified as the person(s) who will aid the patient in the event of a mental health crisis.  With written consent from the patient, two attempts were made to provide suicide prevention education, prior to and/or following the patient's discharge.  We were unsuccessful in providing suicide prevention education.  A suicide education pamphlet was given to the patient to share with family/significant other.  Date and time of first attempt:07/07/17-4:05pm unable to leave voicemail, left message at [phone] number Date and time of second attempt:  Jacqueline MacSara P Canuto [place], LCSW 07/07/2017, 4:07 PM

## 2017-07-07 NOTE — Tx Team (Signed)
Initial Treatment Plan 07/07/2017 2:31 AM Jacqueline Fletcher [id number]    PATIENT STRESSORS: Marital or family conflict   PATIENT STRENGTHS: Capable of independent living Supportive family/friends   PATIENT IDENTIFIED PROBLEMS: Needing out patient tx  Needing to use coping skills  Changing living arrangement due to not getting along with roommate                 DISCHARGE CRITERIA:  Adequate post-discharge living arrangements Improved stabilization in mood, thinking, and/or behavior Verbal commitment to aftercare and medication compliance  PRELIMINARY DISCHARGE PLAN: Outpatient therapy Participate in family therapy  PATIENT/FAMILY INVOLVEMENT: This treatment plan has been presented to and reviewed with the patient, Jacqueline Fletcher    Adnan Vanvoorhis, RN 07/07/2017, 2:31 AM
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
## 2017-07-07 NOTE — Tx Team (Signed)
Initial Treatment Plan 07/07/2017 3:21 AM Jacqueline Fletcher ONG:[id number]    PATIENT STRESSORS: Marital or family conflict   PATIENT STRENGTHS: Capable of independent living Supportive family/friends   PATIENT IDENTIFIED PROBLEMS: SI  Depression                   DISCHARGE CRITERIA:  Ability to meet basic life and health needs Adequate post-discharge living arrangements  PRELIMINARY DISCHARGE PLAN: Outpatient therapy Participate in family therapy  PATIENT/FAMILY INVOLVEMENT: This treatment plan has been presented to and reviewed with the patient, Jacqueline Fletcher,  The patient and family have been given the opportunity to ask questions and make suggestions.  Chancy HurterNichole  [name], RN 07/07/2017, 3:21 AM

## 2017-07-08 NOTE — Progress Notes (Signed)
Rates depression, anxiety and hopelessness as 4/10.  Affect bright.  Smiles easily upon approach.  Denies SI/HI/AVH.  Medication and group compliant.  Active in the milieu. No inappropriate behavior noted.  Support offered.  Safety rounds maintained.

## 2017-07-08 NOTE — Plan of Care (Signed)
Problem: Coping: Goal: Ability to verbalize feelings will improve Outcome: Progressing Patient verbalized feelings to staff.    

## 2017-07-08 NOTE — Plan of Care (Signed)
Problem: Activity: Goal: Interest or engagement in activities will improve Outcome: Progressing Attending groups. Noted in dayroom playing cards with peers.
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
## 2017-07-08 NOTE — [facility] Group Notes (Signed)
[facility] Group Notes:  (Nursing/MHT/Case Management/Adjunct)  Date:  07/08/2017  Time:  7:08 AM  Type of Therapy:  Group Therapy  Participation Level:  Active  Participation Quality:  Appropriate  Affect:  Appropriate and Excited  Cognitive:  Alert and Appropriate  Insight:  Appropriate  Engagement in Group:  Engaged and Supportive  Modes of Intervention:  Discussion, Rapport Building, Socialization and Support  Summary of Progress/Problems:  Jacqueline BullaYASMINE W [facility]MACK 07/08/2017, 7:08 AM

## 2017-07-08 NOTE — [facility] Group Notes (Signed)
[facility] LCSW Group Therapy Note  Date/Time: 07/08/2017, 9:30am  Type of Therapy/Topic:  Group Therapy:  Feelings about Diagnosis  Participation Level:  Active   Mood:OKAY   Description of Group:    This group will allow patients to explore their thoughts and feelings about diagnoses they have received. Patients will be guided to explore their level of understanding and acceptance of these diagnoses. Facilitator will encourage patients to process their thoughts and feelings about the reactions of others to their diagnosis, and will guide patients in identifying ways to discuss their diagnosis with significant others in their lives. This group will be process-oriented, with patients participating in exploration of their own experiences as well as giving and receiving support and challenge from other group members.   Therapeutic Goals: 1. Patient will demonstrate understanding of diagnosis as evidence by identifying two or more symptoms of the disorder:  2. Patient will be able to express two feelings regarding the diagnosis 3. Patient will demonstrate ability to communicate their needs through discussion and/or role plays  Summary of Patient Progress:    Able to meet therapeutic goals listed above.    Therapeutic Modalities:   Cognitive Behavioral Therapy Brief Therapy Feelings Identification    Glennon MacSara P Icis Budreau, LCSW 07/08/2017, 3:50 PM

## 2017-07-08 NOTE — Progress Notes (Signed)
D: Pt denies SI/HI/AVH, patient's affect is pleasant and cooperative. Pt appears less anxious and she is interacting with peers and staff appropriately. Patient's thoughts are organized, and coherent, no bizarre behavior noted.   A: Pt was offered support and encouragement. Pt was given scheduled medications. Pt was encouraged to attend groups. Q 15 minute checks were done for safety.  R:Pt attends groups and interacts well with peers and staff. Pt is taking medication. Pt has no complaints.Pt receptive to treatment and safety maintained on unit.

## 2017-07-08 NOTE — [facility] Group Notes (Signed)
[facility] Group Notes:  (Nursing/MHT/Case Management/Adjunct)  Date:  07/08/2017  Time:  3:10 PM  Type of Therapy:  Psychoeducational Skills  Participation Level:  Active  Participation Quality:  Appropriate, Attentive and Sharing  Affect:  Appropriate  Cognitive:  Alert and Appropriate  Insight:  Appropriate  Engagement in Group:  Engaged  Modes of Intervention:  Discussion, Education and Support  Summary of Progress/Problems:  Lynelle SmokeCara Travis Anayia Eugene 07/08/2017, 3:10 PM

## 2017-07-09 MED ORDER — RISPERIDONE 2 MG PO TABS: 2.0000 mg | ORAL_TABLET | Freq: Two times a day (BID) | ORAL | 1 refills | Status: DC

## 2017-07-09 MED ORDER — FLUVOXAMINE MALEATE 100 MG PO TABS: 100.0000 mg | ORAL_TABLET | Freq: Every day | ORAL | 1 refills | Status: DC

## 2017-07-09 MED ORDER — TRAZODONE HCL 100 MG PO TABS: 100.0000 mg | ORAL_TABLET | Freq: Every day | ORAL | 1 refills | Status: DC

## 2017-07-09 MED ORDER — FLUVOXAMINE MALEATE 50 MG PO TABS
100.0000 mg | ORAL_TABLET | Freq: Every day | ORAL | Status: DC
  Administered 2017-07-09: 100 mg via ORAL
  Filled 2017-07-09: qty 2

## 2017-07-09 NOTE — Progress Notes (Signed)
Patient ID: Jacqueline Fletcher, female   DOB: 09/13/1991, 26 y.o.   MRN: [id number]  CSW meet with patient to discuss her discharge plan. Patient states she only knows of one other family member that could potentially help with housing, a cousin namd Florentina AddisonKatie Fletcher [phone]). CSW obtained number from patient's mother, Ihor GullyMary Reliford. CSW contacted cousin at 2:09pm, no answer, CSW left voicemail asking for returned call. CSW also discussed with patient that she will have to discharge to shelter if no other family members can assist with housing. Patient states she understands this. Mother stated she is unable to assist patient with transportation at discharge due to not having a car. Bus pass to shelter can be provided if needed.   CSW also asked patient if she was willing to meet with [facility], Care Coordinator from [facility] to discuss how she can assist patient after discharge. Patient stated she was willing to meet [facility]. CSW contacted Care Coordinator who stated she will come speak with patient on 07/10/2017 at 8:30am.   [name] G. Garnette CzechSampson MSW, LCSWA 07/09/2017 2:20 PM

## 2017-07-09 NOTE — [facility] Suicide Risk Assessment (Signed)
[facility] INPATIENT:  Family/Significant Other Suicide Prevention Education  Suicide Prevention Education:  Education Completed;Jacqueline Fletcher(mother [phone]), has been identified by the patient as the family member/significant other with whom the patient will be [phone], and identified as the person(s) who will aid the patient in the event of a mental health crisis (suicidal ideations/suicide attempt).  With written consent from the patient, the family member/significant other has been provided the following suicide prevention education, prior to the and/or following the discharge of the patient.  The suicide prevention education provided includes the following:  Suicide risk factors  Suicide prevention and interventions  National Suicide Hotline telephone number  [facility] assessment telephone number  [facility] Emergency Assistance 911  [facility] and/or Residential Mobile Crisis Unit telephone number  Request made of family/significant other to:  Remove weapons (e.g., guns, rifles, knives), all items previously/currently identified as safety concern.    Remove drugs/medications (over-the-counter, prescriptions, illicit drugs), all items previously/currently identified as a safety concern.  The family member/significant other verbalizes understanding of the suicide prevention education information provided.  The family member/significant other agrees to remove the items of safety concern listed above.  Jacqueline Fletcher G. Garnette CzechSampson MSW, LCSWA 07/09/2017, 1:49 PM

## 2017-07-09 NOTE — Plan of Care (Signed)
Problem: Activity: Goal: Imbalance in normal sleep/wake cycle will improve Outcome: Progressing Pt will have at least 5 or more hours of sleep this shift.

## 2017-07-09 NOTE — Progress Notes (Addendum)
Pt is very pleasant. Denies SI, HI, or A/V hallucinations at the present time. Pt compliant with meds, no adverse affects noted. No inappropriate behavior observed. No distress noted. 15 min safety rounds continue. Pt slept 6.75 hrs.

## 2017-07-09 NOTE — Progress Notes (Signed)
Patient ID: Jacqueline Fletcher, female   DOB: 07/22/1991, 26 y.o.   MRN: [id number]  CSW contacted [place] DSS CPS worker, Lenox PondsLeslie Farrow at [phone] to inquire about status of CPS case to determine what patient's options are at discharge. No answer, CSW left voicemail asking for returned call.   Pierce Barocio G. Garnette CzechSampson MSW, LCSWA 07/09/2017 11:54 AM

## 2017-07-09 NOTE — Progress Notes (Addendum)
[facility] MD Progress Note  07/09/2017 1:00 PM Jacqueline Fletcher  MRN:  [id number] Subjective:    07/08/2017. Jacqueline Fletcher has no complaints today. She slept well. There are no side effects from medications. She denies command hallucinations today. She is not suicidal or homicidal. Apparently, she misunderstood instructions given yesterday by CPS SW. The patient is not allowed to move back with her mother while CPS is investigating child abuse case. This complicates things as the patient has no other housing options.   07/09/2017. Jacqueline Fletcher denies any symptoms of depression, anxiety or psychosis. She is not suicidal or homicidal. She participates in programing. There are no somatic complaints. She tolerates medications well. We attempted to contact her CPS worker to finalize discharge plan.   Per nursing: Pt is very pleasant. Denies SI, HI, or A/V hallucinations at the present time. Pt compliant with meds, no adverse affects noted. No inappropriate behavior observed. No distress noted. 15 min safety rounds continue. Pt slept 6.75 hrs.  Principal Problem: Bipolar I disorder, current or most recent episode depressed, with psychotic features [facility]) Diagnosis:   Patient Active Problem List   Diagnosis Date Noted  . Bipolar I disorder, current or most recent episode depressed, with psychotic features ([name]) [F31.5] 07/07/2017   Total Time spent with patient: 30 minutes  Past Psychiatric History: depression.  Past Medical History:  Past Medical History:  Diagnosis Date  . Asthma   . Myocardial infarction ([place])   . Schizophrenia ([place])   . Sleep apnea   . Stroke [facility])     Past Surgical History:  Procedure Laterality Date  . c-section    . TONSILLECTOMY     Family History: History reviewed. No pertinent family history. Family Psychiatric  History: see H&P. Social History:  History  Alcohol Use  . 1.2 oz/week  . 2 Glasses of wine per week     History  Drug Use No    Social History   Social  History  . Marital status: Single    Spouse name: N/A  . Number of children: N/A  . Years of education: N/A   Social History Main Topics  . Smoking status: Never Smoker  . Smokeless tobacco: Never Used  . Alcohol use 1.2 oz/week    2 Glasses of wine per week  . Drug use: No  . Sexual activity: No   Other Topics Concern  . None   Social History Narrative  . None   Additional Social History:    Pain Medications: none Prescriptions: none Over the Counter: See PTA                     Sleep: Fair  Appetite:  Fair  Current Medications: Current Facility-Administered Medications  Medication Dose Route Frequency Provider Last Rate Last Dose  . acetaminophen (TYLENOL) tablet 650 mg  650 mg Oral Q6H PRN Lenward Chancellor, MD      . alum & mag hydroxide-simeth (MAALOX/MYLANTA) 200-200-20 MG/5ML suspension 30 mL  30 mL Oral Q4H PRN Lenward Chancellor, MD      . fluvoxaMINE (LUVOX) tablet 100 mg  100 mg Oral QHS Armanii Pressnell B, MD      . hydrOXYzine (ATARAX/VISTARIL) tablet 25 mg  25 mg Oral Q6H PRN Lenward Chancellor, MD   25 mg at 07/07/17 2145  . ibuprofen (ADVIL,MOTRIN) tablet 800 mg  800 mg Oral TID PRN Lenward Chancellor, MD      . magnesium hydroxide (MILK OF MAGNESIA) suspension 30 mL  30 mL Oral Daily PRN Lenward Chancellor, MD      . risperiDONE (RISPERDAL) tablet 2 mg  2 mg Oral BID Rylie Limburg B, MD   2 mg at 07/09/17 0815  . traZODone (DESYREL) tablet 100 mg  100 mg Oral QHS Belmira Daley B, MD   100 mg at 07/08/17 2118    Lab Results:  No results found for this or any previous visit (from the past 48 hour(s)).  Blood Alcohol level:  Lab Results  Component Value Date   ETH <5 70/48/8891    Metabolic Disorder Labs: No results found for: HGBA1C, MPG No results found for: PROLACTIN Lab Results  Component Value Date   CHOL 130 07/07/2017   TRIG 40 07/07/2017   HDL 55 07/07/2017   CHOLHDL 2.4 07/07/2017   VLDL 8 07/07/2017   LDLCALC 67  07/07/2017    Physical Findings: AIMS: Facial and Oral Movements Muscles of Facial Expression: None, normal Lips and Perioral Area: None, normal Jaw: None, normal Tongue: None, normal,Extremity Movements Upper (arms, wrists, hands, fingers): None, normal Lower (legs, knees, ankles, toes): None, normal, Trunk Movements Neck, shoulders, hips: None, normal, Overall Severity Severity of abnormal movements (highest score from questions above): None, normal Incapacitation due to abnormal movements: None, normal Patient's awareness of abnormal movements (rate only patient's report): No Awareness, Dental Status Current problems with teeth and/or dentures?: No Does patient usually wear dentures?: No  CIWA:    COWS:     Musculoskeletal: Strength & Muscle Tone: within normal limits Gait & Station: normal Patient leans: N/A  Psychiatric Specialty Exam: Physical Exam  Nursing note and vitals reviewed. Psychiatric: She has a normal mood and affect. Her speech is normal and behavior is normal. Thought content normal. Cognition and memory are normal. She expresses impulsivity.    Review of Systems  Psychiatric/Behavioral: Positive for depression and suicidal ideas.  All other systems reviewed and are negative.   Blood pressure (!) 103/57, pulse (!) 108, temperature 98.5 F (36.9 C), resp. rate 16, height '5\' 3"'  (1.6 m), weight 92.5 kg (204 lb), last menstrual period 06/16/2017, SpO2 100 %.Body mass index is 36.14 kg/m.  General Appearance: Casual  Eye Contact:  Good  Speech:  Clear and Coherent  Volume:  Normal  Mood:  Depressed  Affect:  Blunt  Thought Process:  Goal Directed and Descriptions of Associations: Intact  Orientation:  Full (Time, Place, and Person)  Thought Content:  WDL  Suicidal Thoughts:  No  Homicidal Thoughts:  No  Memory:  Immediate;   Fair Recent;   Fair Remote;   Fair  Judgement:  Impaired  Insight:  Shallow  Psychomotor Activity:  Psychomotor Retardation   Concentration:  Concentration: Fair and Attention Span: Fair  Recall:  [employer] of Knowledge:  Fair  Language:  Fair  Akathisia:  No  Handed:  Right  AIMS (if indicated):     Assets:  Communication Skills Desire for Improvement Financial Resources/Insurance Physical Health Resilience Social Support  ADL's:  Intact  Cognition:  WNL  Sleep:  Number of Hours: 6.75     Treatment Plan Summary: Daily contact with patient to assess and evaluate symptoms and progress in treatment and Medication management   Ms. Evers is a 26 year old female with a history of psychotic depression admitted for suicidal thinking and threatening behavior.  1. Suicidal or homicidal ideation. The patient adamantly denies any homicidal thinking and is able to contract for safety in the hospital.  2. Mood. She was  started on Geodon over the weekend. She complains of somnolence. We switched to Risperdal for mood stabilization and added Luvox for depression and OCD.  3. Metabolic syndrome monitoring. Lipid panel and TSH are normal, hemoglobin A1C pending.  4. EKG. Normal winus rhythm, QTc 414.  5. Insomnia. Trazodone is available.  6. Anxiety. Vistaril is available.  7. Social. The patient met with CPS worker and police. She signed temporary custody of her son to her mother until investigation is over. With that, she is no longer able to return to her mother's place. Case was discussed in the length of stay meeting today.  8. Disposition. She will likely be discharged to the homeless shelter. She will follow up with [facility].   Orson Slick, MD 07/09/2017, 1:00 PM

## 2017-07-09 NOTE — Plan of Care (Signed)
Problem: Medication: Goal: Compliance with prescribed medication regimen will improve Outcome: Progressing Compliant with scheduled meds

## 2017-07-09 NOTE — [facility] Group Notes (Signed)
  [facility] LCSW Group Therapy Note  Date/Time: 07/09/17, 0930  Type of Therapy/Topic:  Group Therapy:  Emotion Regulation  Participation Level:  Active   Mood:pleasant  Description of Group:    The purpose of this group is to assist patients in learning to regulate negative emotions and experience positive emotions. Patients will be guided to discuss ways in which they have been vulnerable to their negative emotions. These vulnerabilities will be juxtaposed with experiences of positive emotions or situations, and patients challenged to use positive emotions to combat negative ones. Special emphasis will be placed on coping with negative emotions in conflict situations, and patients will process healthy conflict resolution skills.  Therapeutic Goals: 1. Patient will identify two positive emotions or experiences to reflect on in order to balance out negative emotions:  2. Patient will label two or more emotions that they find the most difficult to experience:  3. Patient will be able to demonstrate positive conflict resolution skills through discussion or role plays:   Summary of Patient Progress: Pt is a very good group participant both in terms of being active in discussion and also also engaging with other group members in a supportive way.  Pt shared that depression, anger, and feeling "stressed" or anxious are difficult emotions for her.  She then shared a number of strategies she uses when she is feeling those ways: reading, listening to music, taking a walk.  Very positive group member.       Therapeutic Modalities:   Cognitive Behavioral Therapy Feelings Identification Dialectical Behavioral Therapy  Daleen SquibbGreg Adonia Porada, LCSW

## 2017-07-09 NOTE — Progress Notes (Signed)
Pt calm, cooperative and pleasant.  Minimal but appropriate interaction with staff and peers.  Reports good sleep, fair appetite, good concentration.  Reports depression, hopelessness and anxiety all 4/10.  Discharge focused.  Superficial but pleasant interaction with this RN.  Med compliant.   Reports plan is for her to discharge tomorrow, "I feel ready."

## 2017-07-09 NOTE — Progress Notes (Signed)
Patient ID: Jacqueline Fletcher, female   DOB: 06/03/1991, 26 y.o.   MRN: [id number]  CSW spoke with assigned CPS case worker, Lenox PondsLeslie Farrow [phone]. Stated that CPS case is still under investigation. CSW informed CPS worker that [employer] had been contacted but no answer. CSW asked if CPS worker could assist with placing patient at shelter until CPS case is resolved. CPS  worker stated she would staff this case with her supervisor and give CSW a call back. No further questions for CSW at this time.   Aleigha Gilani G. Garnette CzechSampson MSW, [facility] 07/09/2017 2:33 PM

## 2017-07-09 NOTE — Progress Notes (Signed)
Recreation Therapy Notes  Date: 08.08.18 Time: 1:00 pm Location: Craft Room  Group Topic: Self-esteem  Goal Area(s) Addresses:  Patient will write at least one positive trait about self. Patient will verbalize benefit of having a healthy self-esteem.  Behavioral Response: Attentive, Interactive  Intervention: I Am  Activity: Patients were given a worksheet with the letter I on it and were instructed to write as many positive traits about themselves inside the letter.  Education: LRT educated patients on ways they can increase their self-esteem.  Education Outcome: Acknowledges education/In group clarification offered  Clinical Observations/Feedback: Patient wrote positive traits about self. Patient contributed to group discussion by stating it was easy and difficult to think of positive traits, and how her self-esteem affects her.  Jacquelynn CreeGreene,Aubree Doody M, LRT/CTRS 07/09/2017 1:43 PM

## 2017-07-10 MED ORDER — TRAZODONE HCL 100 MG PO TABS: 100.0000 mg | ORAL_TABLET | Freq: Every day | ORAL | 1 refills | Status: AC

## 2017-07-10 MED ORDER — FLUVOXAMINE MALEATE 100 MG PO TABS: 100.0000 mg | ORAL_TABLET | Freq: Every day | ORAL | 1 refills | Status: AC

## 2017-07-10 MED ORDER — RISPERIDONE 2 MG PO TABS: 2.0000 mg | ORAL_TABLET | Freq: Two times a day (BID) | ORAL | 1 refills | Status: AC

## 2017-07-10 NOTE — [facility] Suicide Risk Assessment (Signed)
[facility]   Principal Problem: Bipolar I disorder, current or most recent episode depressed, with psychotic features [facility](HCC) Discharge Diagnoses:  Patient Active Problem List   Diagnosis Date Noted  . Bipolar I disorder, current or most recent episode depressed, with psychotic features ([facility]) [F31.5] 07/07/2017    Total Time spent with patient: 30 minutes  Musculoskeletal: Strength & Muscle Tone: within normal limits Gait & Station: normal Patient leans: N/A  Psychiatric Specialty Exam: Review of Systems  All other systems reviewed and are negative.   Blood pressure 104/62, pulse 100, temperature 98.4 F (36.9 C), temperature source Oral, resp. rate 18, height 5\' 3"  (1.6 m), weight 92.5 kg (204 lb), last menstrual period 06/16/2017, SpO2 100 %.Body mass index is 36.14 kg/m.  General Appearance: Casual  Eye Contact::  Good  Speech:  Clear and Coherent409  Volume:  Normal  Mood:  Euthymic  Affect:  Appropriate  Thought Process:  Goal Directed and Descriptions of Associations: Intact  Orientation:  Full (Time, Place, and Person)  Thought Content:  WDL  Suicidal Thoughts:  No  Homicidal Thoughts:  No  Memory:  Immediate;   Fair Recent;   Fair Remote;   Fair  Judgement:  Impaired  Insight:  Shallow  Psychomotor Activity:  Normal  Concentration:  Fair  Recall:  [employer]  Fund of Knowledge:Fair  Language: Fair  Akathisia:  No  Handed:  Right  AIMS (if indicated):     Assets:  Communication Skills Desire for Improvement Financial Resources/Insurance Resilience Social Support  Sleep:  Number of Hours: 7  Cognition: WNL  ADL's:  Intact   Mental Status Per Nursing Assessment::   On Admission:     Demographic Factors:  Low socioeconomic status and Unemployed  Loss Factors: Legal issues  Historical Factors: Family history of mental illness or substance abuse and Impulsivity  Risk Reduction Factors:   Responsible for children under 18 years  of age, Sense of responsibility to family and Positive social support  Continued Clinical Symptoms:  Bipolar Disorder:   Depressive phase  Cognitive Features That Contribute To Risk:  None    Suicide Risk:  Minimal: No identifiable suicidal ideation.  Patients presenting with no risk factors but with morbid ruminations; may be classified as minimal risk based on the severity of the depressive symptoms  Follow-up Information    [facility] Go on 07/11/2017.   Why:  7:00am, Please keep in touch with Peer Support Specialist Unk PintoHarvey Bryant [phone] Contact information: [address] [place] [address] [phone]           Plan Of Care/Follow-up recommendations:  Activity:  as tolerated. Diet:  regular. Other:  keep follow up appointments.  Kristine LineaJolanta Zyiah [place], MD 07/10/2017, 9:19 AM

## 2017-07-10 NOTE — Progress Notes (Signed)
Recreation Therapy Notes  Date: 08.09.18 Time: 1:00 pm Location: Craft Room  Group Topic: Leisure Education  Goal Area(s) Addresses:  Patient will identify activities for each letter of the alphabet.  Patient will verbalize ability to integrate positive leisure into life post d/c. Patient will verbalize ability to use leisure as a Associate Professorcoping skill.  Behavioral Response: Attentive, Interactive  Intervention: Leisure Alphabet  Activity: Patients were given a Leisure Information systems managerAlphabet worksheet and were instructed to write healthy leisure activities for each letter of the alphabet.  Education: LRT educated patients on what they need to participate in leisure.  Education Outcome: Acknowledges education/In group clarification offered   Clinical Observations/Feedback: Patient wrote healthy leisure activities. Patient contributed to group discussion by stating healthy leisure activities.  Jacquelynn CreeGreene,Kenya Kook M, LRT/CTRS 07/10/2017 1:37 PM

## 2017-07-10 NOTE — Plan of Care (Signed)
Problem: Coping: Goal: Ability to cope will improve Outcome: Progressing Emotional support provided. Maintains positive attitude

## 2017-07-10 NOTE — Progress Notes (Signed)
Patient stayed in the milieu, pleasant and cooperative. Alert and oriented and denying thoughts of self harm. Denying hallucinations. Pt attended evening group and was active. Had no major concern. Expressed readiness for discharge "I am ready to go...myalgias neighbor said she will house me when I get out of the hospital".  Has been sleeping well and safety maintained.

## 2017-07-10 NOTE — Progress Notes (Signed)
  [facility] :  Will you be returning to the same living situation after discharge:  No. At discharge, do you have transportation home?: Yes,  cousin Do you have the ability to pay for your medications: Yes,  patient has Medicaid insurance  Release of information consent forms completed and in the chart;  Patient's signature needed at discharge.  Patient to Follow up at: Follow-up Information    [facility] Go on 07/11/2017.   Why:  7:00am, Please keep in touch with Peer Support Services Unk PintoHarvey Bryant [phone].  Contact information: [address] [place] [address] [phone]           Next level of care provider has [facility] to [facility] Link:no  Safety Planning and Suicide Prevention discussed: Yes,  with patient, mother, and cousin  Have you used any form of tobacco in the last 30 days? (Cigarettes, Smokeless Tobacco, Cigars, and/or Pipes): No  Has patient been referred to the Quitline?: N/A patient is not a smoker  Patient has been referred for addiction treatment: Yes  Arelia Longestmaris G Raynesha Tiedt, LCSWA 07/10/2017, 10:31 AM

## 2017-07-10 NOTE — Progress Notes (Signed)
Patient ID: Jacqueline Fletcher, female   DOB: 01/05/1991, 26 y.o.   MRN: [id number]  CSW made contact with patient's cousin, Jacqueline Fletcher [phone]. CSW reviewed with cousin patient's discharge plan. Peer support specialist, Jacqueline Fletcher also spoke with cousin about patient's appointment tomorrow with [facility] and what she will need to bring. Cousin had no further questions for CSW at this time.  CSW also contacted patient's mother, Jacqueline Fletcher, and informed her of where patient will be going at discharge. No further questions for CSW at this time.   Monique Gift G. Garnette CzechSampson MSW, [facility] 07/10/2017 10:47 AM

## 2017-07-10 NOTE — Plan of Care (Signed)
Problem: Activity: Goal: Interest or engagement in leisure activities will improve Outcome: Progressing Involved in activities on the unit. Played cards with peers
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
## 2017-07-10 NOTE — Plan of Care (Signed)
Problem: Safety: Goal: Ability to disclose and discuss suicidal ideas will improve Outcome: Progressing Able to express her feelings as needed.

## 2017-07-10 NOTE — Progress Notes (Signed)
CH follow-up with patient. Patient was packing her belongings as she is being discharged today. Patient is ready for a second chance now that medication has helped to ease her anger. Patient would like to find a new job and get her child back.    07/10/17 0935  Clinical Encounter Type  Visited With Patient  Visit Type Follow-up;Spiritual support;Behavioral Health  Referral From Patient  Spiritual Encounters  Spiritual Needs Emotional;Prayer

## 2017-07-10 NOTE — Progress Notes (Signed)
Patient ID: Jacqueline Fletcher, female   DOB: 09/09/1991, 26 y.o.   MRN: [id number]  Patient met with CSW, [facility] Care Coordinator [name], and peer support specialist with [facility] outpatient services. Care coordinator wanted to meet with patient to discuss how she can support patient after discharge. Peer support specialist reviewed with the patient the discharge plan and also assessed for for CST(community support team) services with [facility]. Patient had no further questions for CSW at this time.   CSW also spoke with Leslie Farrow, CPS case worker assigned to patient's CPS case. Informed CSW that she has coordinated a living arrangement for patient to stay with cousin Sasha Watts ([phone]). She will be staying at address [address], [place] [address]. Cousin will be picking patient up at discharge. Harvey Bryant, Peer support specialist was made aware of the address change. [name], Care Coordinator with [employer] was made aware of the change as well.    G.  MSW, LCSWA 07/10/2017 10:30 AM 

## 2017-07-10 NOTE — Progress Notes (Signed)
Pt calm, cooperative and pleasant. Affect remains flat.  Minimal interaction unless approached by others.  Pleasant.  Denies SI/HI/AVH.  Reports she feels ready for discharge and more able to handle stressors than when first admitted.  Discharge orders placed.  Discharge instructions reviewed with pt including follow up appts, prescriptions and crisis help lines.  Pt verbalized understanding.  All belongings returned to pt.  Escorted off unit, no acute distress.

## 2017-07-10 NOTE — [facility] Group Notes (Signed)
Goals Group Date/Time: 07/10/2017 9:00 AM Type of Therapy and Topic: Group Therapy: Goals Group: SMART Goals   Participation Level: Moderate  Description of Group:    The purpose of a daily goals group is to assist and guide patients in setting recovery/wellness-related goals. The objective is to set goals as they relate to the crisis in which they were admitted. Patients will be using SMART goal modalities to set measurable goals. Characteristics of realistic goals will be discussed and patients will be assisted in setting and processing how one will reach their goal. Facilitator will also assist patients in applying interventions and coping skills learned in psycho-education groups to the SMART goal and process how one will achieve defined goal.   Therapeutic Goals:   -Patients will develop and document one goal related to or their crisis in which brought them into treatment.  -Patients will be guided by LCSW using SMART goal setting modality in how to set a measurable, attainable, realistic and time sensitive goal.  -Patients will process barriers in reaching goal.  -Patients will process interventions in how to overcome and successful in reaching goal.   Patient's Goal:PT goal for today is to attend groups, meet with MD, and take a shower.  CSW asked pt to consider a longer term goal and she identified trying to find independent housing for her and her son.  She has already applied at one place and needs to follow up as she is approved for housing assistance.   Therapeutic Modalities:  Motivational Interviewing  Research officer, political partyCognitive Behavioral Therapy  Crisis Intervention Model  SMART goals setting   Daleen SquibbGreg Adira [place], [place]

## 2017-07-10 NOTE — [facility] Group Notes (Signed)
[facility] LCSW Group Therapy Note  Date/Time: 07/10/17, 0930  Type of Therapy/Topic:  Group Therapy:  Balance in Life  Participation Level:  active  Description of Group:    This group will address the concept of balance and how it feels and looks when one is unbalanced. Patients will be encouraged to process areas in their lives that are out of balance, and identify reasons for remaining unbalanced. Facilitators will guide patients utilizing problem- solving interventions to address and correct the stressor making their life unbalanced. Understanding and applying boundaries will be explored and addressed for obtaining  and maintaining a balanced life. Patients will be encouraged to explore ways to assertively make their unbalanced needs known to significant others in their lives, using other group members and facilitator for support and feedback.  Therapeutic Goals: 1. Patient will identify two or more emotions or situations they have that consume much of in their lives. 2. Patient will identify signs/triggers that life has become out of balance:  3. Patient will identify two ways to set boundaries in order to achieve balance in their lives:  4. Patient will demonstrate ability to communicate their needs through discussion and/or role plays  Summary of Patient Progress:Pt identified the following areas as being out of balance in her life: physical, mental/emotional, family relationships and financial.  Pt lives with family and would like to move out.  She is not currently working and would like to find employment.  She has been struggling with depression and does feel like she made significant progress in [facility] that [facility].          Therapeutic Modalities:   Cognitive Behavioral Therapy Solution-Focused Therapy Assertiveness Training  Daleen SquibbGreg Braxon [place], [place]

## 2017-07-10 NOTE — Plan of Care (Signed)
Problem: Medication: Goal: Compliance with prescribed medication regimen will improve Outcome: Progressing Taking medications as prescribed   

## 2017-11-11 ENCOUNTER — Other Ambulatory Visit: Payer: Self-pay

## 2017-11-11 ENCOUNTER — Emergency Department
Admission: EM | Admit: 2017-11-11 | Discharge: 2017-11-11 | Disposition: A | Discharge status: Home / Self Care | Payer: Medicaid Other | Attending: Student in an Organized Health Care Education/Training Program | Admitting: Student in an Organized Health Care Education/Training Program

## 2017-11-11 ENCOUNTER — Emergency Department: Payer: Medicaid Other

## 2017-11-11 DIAGNOSIS — Z79899 Other long term (current) drug therapy: Secondary | ICD-10-CM | POA: Insufficient documentation

## 2017-11-11 DIAGNOSIS — R05 Cough: Secondary | ICD-10-CM | POA: Diagnosis present

## 2017-11-11 DIAGNOSIS — J069 Acute upper respiratory infection, unspecified: Secondary | ICD-10-CM | POA: Insufficient documentation

## 2017-11-11 DIAGNOSIS — J45909 Unspecified asthma, uncomplicated: Secondary | ICD-10-CM | POA: Insufficient documentation

## 2017-11-11 HISTORY — DX: Bipolar disorder, current episode depressed, severe, with psychotic features: F31.5

## 2017-11-11 MED ORDER — BENZONATATE 100 MG PO CAPS: 100.0000 mg | ORAL_CAPSULE | Freq: Three times a day (TID) | ORAL | 0 refills | Status: AC | PRN

## 2017-11-11 MED ORDER — BENZONATATE 100 MG PO CAPS
200.0000 mg | ORAL_CAPSULE | Freq: Once | ORAL | Status: AC
  Administered 2017-11-11: 200 mg via ORAL
  Filled 2017-11-11: qty 2

## 2017-11-11 NOTE — ED Triage Notes (Signed)
Pt arrives to ED via POV with c/o cough and emesis x2 days. Pt reports productive cough (green sputum) and 3 episodes of emesis in the last 24 hrs. Pt denies use of any OTC medications PTA.

## 2017-11-11 NOTE — ED Provider Notes (Signed)
[facility] Emergency Department Provider Note  ____________________________________________  Time seen: Approximately 11:25 PM  I have reviewed the triage vital signs and the nursing notes.   HISTORY  Chief Complaint Cough and Emesis    HPI Jacqueline Fletcher is a 26 y.o. female presents to the emergency department with rhinorrhea, congestion, nonproductive cough and posttussive emesis for the past 2 days.  Patient is also had low-grade fever.  No recent travel.  She is tolerating fluids and food by mouth with no major changes in stooling or urinary habits.  No alleviating measures have been attempted.   Past Medical History:  Diagnosis Date  . Asthma   . Bipolar I disorder, current or most recent episode depressed, with psychotic features ([facility])   . Myocardial infarction ([facility])   . Schizophrenia ([facility])   . Sleep apnea   . Stroke [facility])     Patient Active Problem List   Diagnosis Date Noted  . Bipolar I disorder, current or most recent episode depressed, with psychotic features ([facility]) 07/07/2017    Past Surgical History:  Procedure Laterality Date  . c-section    . TONSILLECTOMY      Prior to Admission medications   Medication Sig Start Date End Date Taking? Authorizing Provider  benzonatate (TESSALON PERLES) 100 MG capsule Take 1 capsule (100 mg total) by mouth 3 (three) times daily as needed for up to 7 days for cough. 11/11/17 11/18/17  Orvil FeilWoods, Herman Mell M, PA-C  fluvoxaMINE (LUVOX) 100 MG tablet Take 1 tablet (100 mg total) by mouth at bedtime. 07/10/17   Pucilowska, Jolanta B, MD  risperiDONE (RISPERDAL) 2 MG tablet Take 1 tablet (2 mg total) by mouth 2 (two) times daily. 07/10/17   Pucilowska, Braulio ConteJolanta B, MD  traZODone (DESYREL) 100 MG tablet Take 1 tablet (100 mg total) by mouth at bedtime. 07/10/17   Pucilowska, Ellin GoodieJolanta B, MD    Allergies Patient has no known allergies.  No family history on file.  Social History Social History   Tobacco Use  .  Smoking status: Never Smoker  . Smokeless tobacco: Never Used  Substance Use Topics  . Alcohol use: Yes    Alcohol/week: 1.2 oz    Types: 2 Glasses of wine per week  . Drug use: No      Review of Systems  Constitutional: Patient has fever.  Eyes: No visual changes. No discharge ENT: Patient has congestion.  Cardiovascular: no chest pain. Respiratory: Patient has cough.  Gastrointestinal: No abdominal pain.  No nausea, no vomiting.  No diarrhea. Genitourinary: Negative for dysuria. No hematuria Musculoskeletal: Patient has myalgias.  Skin: Negative for rash, abrasions, lacerations, ecchymosis. Neurological: Patient has headache, no focal weakness or numbness.    ____________________________________________   PHYSICAL EXAM:  VITAL SIGNS: ED Triage Vitals  Enc Vitals Group     BP 11/11/17 2056 103/70     Pulse Rate 11/11/17 2056 (!) 107     Resp 11/11/17 2056 20     Temp 11/11/17 2056 100.1 F (37.8 C)     Temp Source 11/11/17 2056 Oral     SpO2 11/11/17 2056 100 %     Weight 11/11/17 2055 160 lb (72.6 kg)     Height 11/11/17 2055 5\' 2"  (1.575 m)     Head Circumference --      Peak Flow --      Pain Score 11/11/17 2055 6     Pain Loc --      Pain Edu? --  Excl. in GC? --     Constitutional: Alert and oriented. Patient is lying supine. Eyes: Conjunctivae are normal. PERRL. EOMI. Head: Atraumatic. ENT:      Ears: Tympanic membranes are mildly injected with mild effusion bilaterally.       Nose: No congestion/rhinnorhea.      Mouth/Throat: Mucous membranes are moist. Posterior pharynx is mildly erythematous.  Hematological/Lymphatic/Immunilogical: No cervical lymphadenopathy.  Cardiovascular: Normal rate, regular rhythm. Normal S1 and S2.  Good peripheral circulation. Respiratory: Normal respiratory effort without tachypnea or retractions. Lungs CTAB. Good air entry to the bases with no decreased or absent breath sounds. Gastrointestinal: Bowel sounds 4  quadrants. Soft and nontender to palpation. No guarding or rigidity. No palpable masses. No distention. No CVA tenderness. Musculoskeletal: Full range of motion to all extremities. No gross deformities appreciated. Neurologic:  Normal speech and language. No gross focal neurologic deficits are appreciated.  Skin:  Skin is warm, dry and intact. No rash noted. Psychiatric: Mood and affect are normal. Speech and behavior are normal. Patient exhibits appropriate insight and judgement.  ____________________________________________   LABS (all labs ordered are listed, but only abnormal results are displayed)  Labs Reviewed - No data to display ____________________________________________  EKG   ____________________________________________  RADIOLOGY Geraldo PitterI, Donna Snooks M Khrystyna Schwalm, personally viewed and evaluated these images (plain radiographs) as part of my medical decision making, as well as reviewing the written report by the radiologist.  Dg Chest 2 View  Result Date: 11/11/2017 CLINICAL DATA:  Productive cough and emesis for 24 hours. EXAM: CHEST  2 VIEW COMPARISON:  09/05/2014 FINDINGS: The lungs are clear. The pulmonary vasculature is normal. Heart size is normal. Hilar and mediastinal contours are unremarkable. There is no pleural effusion. IMPRESSION: No active cardiopulmonary disease. Electronically Signed   By: Ellery Plunkaniel R Mitchell M.D.   On: 11/11/2017 22:25    ____________________________________________    PROCEDURES  Procedure(s) performed:    Procedures    Medications  benzonatate (TESSALON) capsule 200 mg (200 mg Oral Given 11/11/17 2243)     ____________________________________________   INITIAL IMPRESSION / ASSESSMENT AND PLAN / ED COURSE  Pertinent labs & imaging results that were available during my care of the patient were reviewed by me and considered in my medical decision making (see chart for details).  Review of the [place] CSRS was performed in accordance of the  [place] prior to dispensing any controlled drugs.    Assessment and plan Viral upper respiratory tract infection Patient presents to the emergency department with rhinorrhea, congestion, nonproductive cough and low-grade fever for the past 2 days.  History and physical exam findings are consistent with a viral upper respiratory tract infection.  Patient was discharged with [facility].  She was advised to follow-up with primary care as needed.  All patient questions were answered.   ____________________________________________  FINAL CLINICAL IMPRESSION(S) / ED DIAGNOSES  Final diagnoses:  Viral upper respiratory illness      NEW MEDICATIONS STARTED DURING THIS VISIT:  ED Discharge Orders        Ordered    benzonatate (TESSALON PERLES) 100 MG capsule  3 times daily PRN     11/11/17 2234          This chart was dictated using voice recognition software/Dragon. Despite best efforts to proofread, errors can occur which can change the meaning. Any change was purely unintentional.    Orvil FeilWoods, Nazeer Romney M, PA-C 11/11/17 2328    Willy Eddyobinson, Patrick, MD 11/11/17 [phone]

## 2017-11-11 NOTE — ED Notes (Signed)
Patient reports emesis is after coughing repeatedly.

## 2018-01-03 ENCOUNTER — Other Ambulatory Visit: Payer: Self-pay

## 2018-01-03 ENCOUNTER — Emergency Department
Admission: EM | Admit: 2018-01-03 | Discharge: 2018-01-03 | Disposition: A | Discharge status: Home / Self Care | Payer: Medicaid Other | Attending: Emergency Medicine | Admitting: Emergency Medicine

## 2018-01-03 ENCOUNTER — Encounter: Payer: Self-pay | Admitting: Emergency Medicine

## 2018-01-03 DIAGNOSIS — Z79899 Other long term (current) drug therapy: Secondary | ICD-10-CM | POA: Insufficient documentation

## 2018-01-03 DIAGNOSIS — J45909 Unspecified asthma, uncomplicated: Secondary | ICD-10-CM | POA: Insufficient documentation

## 2018-01-03 DIAGNOSIS — J069 Acute upper respiratory infection, unspecified: Secondary | ICD-10-CM | POA: Insufficient documentation

## 2018-01-03 DIAGNOSIS — J029 Acute pharyngitis, unspecified: Secondary | ICD-10-CM | POA: Insufficient documentation

## 2018-01-03 DIAGNOSIS — R05 Cough: Secondary | ICD-10-CM | POA: Diagnosis present

## 2018-01-03 LAB — GROUP A STREP BY PCR: Group A Strep by PCR: NOT DETECTED

## 2018-01-03 MED ORDER — BENZONATATE 100 MG PO CAPS: 100.0000 mg | ORAL_CAPSULE | Freq: Three times a day (TID) | ORAL | 0 refills | Status: AC | PRN

## 2018-01-03 MED ORDER — IPRATROPIUM-ALBUTEROL 0.5-2.5 (3) MG/3ML IN SOLN: 3.0000 mL | Freq: Once | RESPIRATORY_TRACT | Status: DC

## 2018-01-03 MED ORDER — IPRATROPIUM-ALBUTEROL 0.5-2.5 (3) MG/3ML IN SOLN
3.0000 mL | Freq: Once | RESPIRATORY_TRACT | Status: AC
  Administered 2018-01-03: 3 mL via RESPIRATORY_TRACT

## 2018-01-03 MED ORDER — IPRATROPIUM-ALBUTEROL 0.5-2.5 (3) MG/3ML IN SOLN
RESPIRATORY_TRACT | Status: AC
  Administered 2018-01-03: 3 mL via RESPIRATORY_TRACT
  Filled 2018-01-03: qty 3

## 2018-01-03 MED ORDER — ALBUTEROL SULFATE HFA 108 (90 BASE) MCG/ACT IN AERS: 2.0000 | INHALATION_SPRAY | RESPIRATORY_TRACT | 0 refills | Status: AC | PRN

## 2018-01-03 NOTE — ED Triage Notes (Signed)
Pt ambulatory to triage in NAD, report sore throat and productive cough x 2 days with white sputum.  States has not taken any OTC medications.

## 2018-01-03 NOTE — ED Provider Notes (Signed)
[facility] Emergency Department Provider Note  ____________________________________________  Time seen: Approximately 5:59 PM  I have reviewed the triage vital signs and the nursing notes.   HISTORY  Chief Complaint Sore Throat and Cough    HPI Jacqueline Fletcher is a 27 y.o. female presents to the emergency department with rhinorrhea, congestion and nonproductive cough for the past 2 days.  No fever or chills.  No emesis or diarrhea.  Patient has been tolerating fluids by mouth with no major changes in stooling or urinary habits.  No alleviating measures of been attempted.   Past Medical History:  Diagnosis Date  . Asthma   . Bipolar I disorder, current or most recent episode depressed, with psychotic features (HCC)   . Myocardial infarction (HCC)   . Schizophrenia (HCC)   . Sleep apnea   . Stroke [facility](HCC)     Patient Active Problem List   Diagnosis Date Noted  . Bipolar I disorder, current or most recent episode depressed, with psychotic features (HCC) 07/07/2017    Past Surgical History:  Procedure Laterality Date  . c-section    . TONSILLECTOMY      Prior to Admission medications   Medication Sig Start Date End Date Taking? Authorizing Provider  albuterol (PROVENTIL HFA;VENTOLIN HFA) 108 (90 Base) MCG/ACT inhaler Inhale 2 puffs into the lungs every 4 (four) hours as needed for wheezing or shortness of breath. 01/03/18   Cuthriell, Delorise RoyalsJonathan D, PA-C  benzonatate (TESSALON PERLES) 100 MG capsule Take 1 capsule (100 mg total) by mouth 3 (three) times daily as needed for up to 7 days for cough. 01/03/18 01/10/18  Orvil FeilWoods, Zadkiel Dragan M, PA-C  fluvoxaMINE (LUVOX) 100 MG tablet Take 1 tablet (100 mg total) by mouth at bedtime. 07/10/17   Pucilowska, Jolanta B, MD  risperiDONE (RISPERDAL) 2 MG tablet Take 1 tablet (2 mg total) by mouth 2 (two) times daily. 07/10/17   Pucilowska, Braulio ConteJolanta B, MD  traZODone (DESYREL) 100 MG tablet Take 1 tablet (100 mg total) by mouth at  bedtime. 07/10/17   Pucilowska, Ellin GoodieJolanta B, MD    Allergies Patient has no known allergies.  History reviewed. No pertinent family history.  Social History Social History   Tobacco Use  . Smoking status: Never Smoker  . Smokeless tobacco: Never Used  Substance Use Topics  . Alcohol use: Yes    Alcohol/week: 1.2 oz    Types: 2 Glasses of wine per week  . Drug use: No     Review of Systems  Constitutional: No fever/chills Eyes: No visual changes. No discharge ENT: Patient has congestion and rhinorrhea. Cardiovascular: no chest pain. Respiratory: Patient has cough Gastrointestinal: No abdominal pain.  No nausea, no vomiting.  No diarrhea.  No constipation. Musculoskeletal: Negative for musculoskeletal pain. Skin: Negative for rash, abrasions, lacerations, ecchymosis. Neurological: Negative for headaches, focal weakness or numbness.  ____________________________________________   PHYSICAL EXAM:  VITAL SIGNS: ED Triage Vitals [01/03/18 1513]  Enc Vitals Group     BP 116/76     Pulse Rate (!) 110     Resp 18     Temp 98.5 F (36.9 C)     Temp Source Oral     SpO2 100 %     Weight 230 lb (104.3 kg)     Height 5\' 2"  (1.575 m)     Head Circumference      Peak Flow      Pain Score 8     Pain Loc  Pain Edu?      Excl. in GC?      Constitutional: Alert and oriented. Patient is lying supine. Eyes: Conjunctivae are normal. PERRL. EOMI. Head: Atraumatic. ENT:      Ears: Tympanic membranes are mildly injected with mild effusion bilaterally.       Nose: No congestion/rhinnorhea.      Mouth/Throat: Mucous membranes are moist. Posterior pharynx is mildly erythematous.  Hematological/Lymphatic/Immunilogical: No cervical lymphadenopathy.  Cardiovascular: Normal rate, regular rhythm. Normal S1 and S2.  Good peripheral circulation. Respiratory: Normal respiratory effort without tachypnea or retractions. Lungs CTAB. Good air entry to the bases with no decreased or absent  breath sounds. Gastrointestinal: Bowel sounds 4 quadrants. Soft and nontender to palpation. No guarding or rigidity. No palpable masses. No distention. No CVA tenderness. Musculoskeletal: Full range of motion to all extremities. No gross deformities appreciated. Neurologic:  Normal speech and language. No gross focal neurologic deficits are appreciated.  Skin:  Skin is warm, dry and intact. No rash noted. Psychiatric: Mood and affect are normal. Speech and behavior are normal. Patient exhibits appropriate insight and judgement.   ____________________________________________   LABS (all labs ordered are listed, but only abnormal results are displayed)  Labs Reviewed  GROUP A STREP BY PCR   ____________________________________________  EKG   ____________________________________________  RADIOLOGY  No results found.  ____________________________________________    PROCEDURES  Procedure(s) performed:    Procedures    Medications  ipratropium-albuterol (DUONEB) 0.5-2.5 (3) MG/3ML nebulizer solution 3 mL (3 mLs Nebulization Given 01/03/18 1740)     ____________________________________________   INITIAL IMPRESSION / ASSESSMENT AND PLAN / ED COURSE  Pertinent labs & imaging results that were available during my care of the patient were reviewed by me and considered in my medical decision making (see chart for details).  Review of the [place] CSRS was performed in accordance of the [facility] prior to dispensing any controlled drugs.    Assessment and plan Viral URI Differential diagnosis includes influenza versus unspecified viral URI.  History and physical exam findings are consistent with unspecified viral URI at this time.  Patient was discharged with [facility] and an albuterol inhaler.  Vital signs are reassuring prior to discharge.   ____________________________________________  FINAL CLINICAL IMPRESSION(S) / ED DIAGNOSES  Final diagnoses:  Viral upper  respiratory tract infection      NEW MEDICATIONS STARTED DURING THIS VISIT:  ED Discharge Orders        Ordered    benzonatate (TESSALON PERLES) 100 MG capsule  3 times daily PRN     01/03/18 1730    albuterol (PROVENTIL HFA;VENTOLIN HFA) 108 (90 Base) MCG/ACT inhaler  Every 4 hours PRN     01/03/18 1743          This chart was dictated using voice recognition software/Dragon. Despite best efforts to proofread, errors can occur which can change the meaning. Any change was purely unintentional.    Orvil Feil, PA-C 01/03/18 1801    Emily Filbert, MD 01/03/18 Serena Croissant

## 2018-02-20 ENCOUNTER — Emergency Department: Payer: Worker's Compensation

## 2018-02-20 ENCOUNTER — Emergency Department
Admission: EM | Admit: 2018-02-20 | Discharge: 2018-02-20 | Disposition: A | Discharge status: Home / Self Care | Payer: Worker's Compensation | Attending: Emergency Medicine | Admitting: Emergency Medicine

## 2018-02-20 DIAGNOSIS — W208XXA Other cause of strike by thrown, projected or falling object, initial encounter: Secondary | ICD-10-CM | POA: Diagnosis not present

## 2018-02-20 DIAGNOSIS — Y990 Civilian activity done for income or pay: Secondary | ICD-10-CM | POA: Insufficient documentation

## 2018-02-20 DIAGNOSIS — J45909 Unspecified asthma, uncomplicated: Secondary | ICD-10-CM | POA: Diagnosis not present

## 2018-02-20 DIAGNOSIS — Y939 Activity, unspecified: Secondary | ICD-10-CM | POA: Insufficient documentation

## 2018-02-20 DIAGNOSIS — M79641 Pain in right hand: Secondary | ICD-10-CM | POA: Diagnosis not present

## 2018-02-20 DIAGNOSIS — Y9289 Other specified places as the place of occurrence of the external cause: Secondary | ICD-10-CM | POA: Diagnosis not present

## 2018-02-20 DIAGNOSIS — Z79899 Other long term (current) drug therapy: Secondary | ICD-10-CM | POA: Insufficient documentation

## 2018-02-20 MED ORDER — MELOXICAM 15 MG PO TABS: 15.0000 mg | ORAL_TABLET | Freq: Every day | ORAL | 1 refills | Status: AC

## 2018-02-20 NOTE — ED Triage Notes (Signed)
Patient reports pallet fell onto right hand at work

## 2018-02-20 NOTE — ED Provider Notes (Signed)
[facility] Emergency Department Provider Note  ____________________________________________  Time seen: Approximately 9:27 PM  I have reviewed the triage vital signs and the nursing notes.   HISTORY  Chief Complaint Hand Injury (right)    HPI Jacqueline Fletcher is a 27 y.o. female presents to the emergency department with right hand pain after a palate fell on her right hand at work.  Patient denies weakness, radiculopathy or changes in sensation of the upper extremities.  Patient denies prior right hand traumas in the past.  No alleviating measures have been attempted.   Past Medical History:  Diagnosis Date  . Asthma   . Bipolar I disorder, current or most recent episode depressed, with psychotic features (HCC)   . Myocardial infarction (HCC)   . Schizophrenia (HCC)   . Sleep apnea   . Stroke [facility](HCC)     Patient Active Problem List   Diagnosis Date Noted  . Bipolar I disorder, current or most recent episode depressed, with psychotic features (HCC) 07/07/2017    Past Surgical History:  Procedure Laterality Date  . c-section    . TONSILLECTOMY      Prior to Admission medications   Medication Sig Start Date End Date Taking? Authorizing Provider  albuterol (PROVENTIL HFA;VENTOLIN HFA) 108 (90 Base) MCG/ACT inhaler Inhale 2 puffs into the lungs every 4 (four) hours as needed for wheezing or shortness of breath. 01/03/18   Cuthriell, Delorise RoyalsJonathan D, PA-C  fluvoxaMINE (LUVOX) 100 MG tablet Take 1 tablet (100 mg total) by mouth at bedtime. 07/10/17   Pucilowska, Braulio ConteJolanta B, MD  meloxicam (MOBIC) 15 MG tablet Take 1 tablet (15 mg total) by mouth daily for 7 days. 02/20/18 02/27/18  Orvil FeilWoods, Camron Monday M, PA-C  risperiDONE (RISPERDAL) 2 MG tablet Take 1 tablet (2 mg total) by mouth 2 (two) times daily. 07/10/17   Pucilowska, Braulio ConteJolanta B, MD  traZODone (DESYREL) 100 MG tablet Take 1 tablet (100 mg total) by mouth at bedtime. 07/10/17   Pucilowska, Ellin GoodieJolanta B, MD     Allergies Shellfish allergy  No family history on file.  Social History Social History   Tobacco Use  . Smoking status: Never Smoker  . Smokeless tobacco: Never Used  Substance Use Topics  . Alcohol use: Yes    Alcohol/week: 1.2 oz    Types: 2 Glasses of wine per week  . Drug use: No     Review of Systems  Constitutional: No fever/chills Eyes: No visual changes. No discharge ENT: No upper respiratory complaints. Cardiovascular: no chest pain. Respiratory: no cough. No SOB. Gastrointestinal: No abdominal pain.  No nausea, no vomiting.  No diarrhea.  No constipation. Musculoskeletal: Patient has right hand pain.  Skin: Negative for rash, abrasions, lacerations, ecchymosis. Neurological: Negative for headaches, focal weakness or numbness.  ____________________________________________   PHYSICAL EXAM:  VITAL SIGNS: ED Triage Vitals  Enc Vitals Group     BP 02/20/18 2012 111/60     Pulse Rate 02/20/18 2012 100     Resp 02/20/18 2120 18     Temp 02/20/18 2012 97.6 F (36.4 C)     Temp Source 02/20/18 2012 Oral     SpO2 02/20/18 2012 100 %     Weight 02/20/18 2013 230 lb (104.3 kg)     Height --      Head Circumference --      Peak Flow --      Pain Score 02/20/18 2013 8     Pain Loc --  Pain Edu? --      Excl. in GC? --      Constitutional: Alert and oriented. Well appearing and in no acute distress. Eyes: Conjunctivae are normal. PERRL. EOMI. Head: Atraumatic.  Cardiovascular: Normal rate, regular rhythm. Normal S1 and S2.  Good peripheral circulation. Respiratory: Normal respiratory effort without tachypnea or retractions. Lungs CTAB. Good air entry to the bases with no decreased or absent breath sounds. Musculoskeletal: Patient performs full range of motion at the right wrist.  She is able to move all 5 right fingers and has no pain elicited with palpation over the metacarpals.  Palpable radial pulse, right. Neurologic:  Normal speech and language.  No gross focal neurologic deficits are appreciated.  Skin:  Skin is warm, dry and intact. No rash noted. Psychiatric: Mood and affect are normal. Speech and behavior are normal. Patient exhibits appropriate insight and judgement.   ____________________________________________   LABS (all labs ordered are listed, but only abnormal results are displayed)  Labs Reviewed - No data to display ____________________________________________  EKG   ____________________________________________  RADIOLOGY Geraldo Pitter, personally viewed and evaluated these images (plain radiographs) as part of my medical decision making, as well as reviewing the written report by the radiologist.  Dg Hand Complete Right  Result Date: 02/20/2018 CLINICAL DATA:  Pallet fell on RIGHT hand at work, injury to the third through fifth digits EXAM: RIGHT HAND - COMPLETE 3+ VIEW COMPARISON:  None FINDINGS: Osseous mineralization normal. Joint spaces preserved. No acute fracture, dislocation, or bone destruction. IMPRESSION: No acute osseous abnormalities. Electronically Signed   By: Ulyses Southward M.D.   On: 02/20/2018 20:35    ____________________________________________    PROCEDURES  Procedure(s) performed:    Procedures    Medications - No data to display   ____________________________________________   INITIAL IMPRESSION / ASSESSMENT AND PLAN / ED COURSE  Pertinent labs & imaging results that were available during my care of the patient were reviewed by me and considered in my medical decision making (see chart for details).  Review of the [place] CSRS was performed in accordance of the [facility] prior to dispensing any controlled drugs.     Assessment and plan Right hand contusion Patient presents to the emergency department with right hand pain after a palate fell on her hand.  X-ray examination reveals no acute fractures or bony abnormalities.  Patient was advised to take meloxicam as needed for  pain.  A referral was made to orthopedics and a work note was given.  All patient questions were answered.   ____________________________________________  FINAL CLINICAL IMPRESSION(S) / ED DIAGNOSES  Final diagnoses:  Right hand pain      NEW MEDICATIONS STARTED DURING THIS VISIT:  ED Discharge Orders        Ordered    meloxicam (MOBIC) 15 MG tablet  Daily     02/20/18 2108          This chart was dictated using voice recognition software/Dragon. Despite best efforts to proofread, errors can occur which can change the meaning. Any change was purely unintentional.    Orvil Feil, PA-C 02/20/18 2131    Nita Sickle, MD 02/21/18 [phone]

## 2018-02-20 NOTE — ED Notes (Signed)
Pt. Signed Chain of custody and consent form with left hand.

## 2018-03-30 IMAGING — DX DG HAND COMPLETE 3+V*R*
3 series · 3 of 3 positions shown · non-contrast
Comparison: None

CLINICAL DATA: Pallet fell on RIGHT hand at work, injury to the
third through fifth digits

EXAM:
RIGHT HAND - COMPLETE 3+ VIEW

[hand ap]
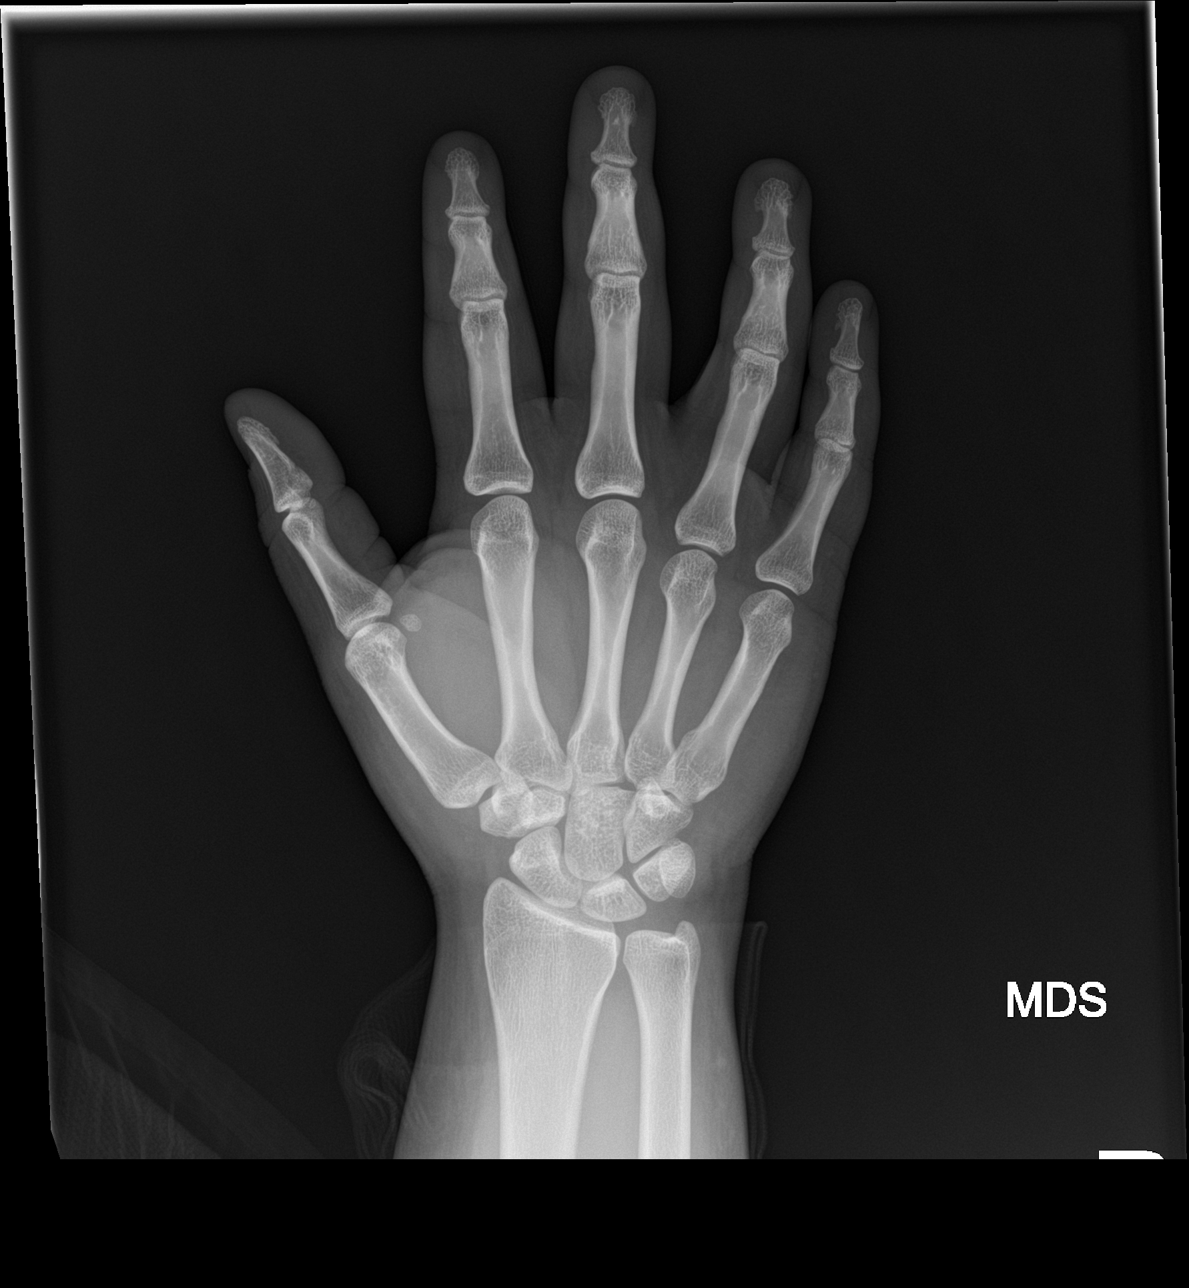

[hand obl]
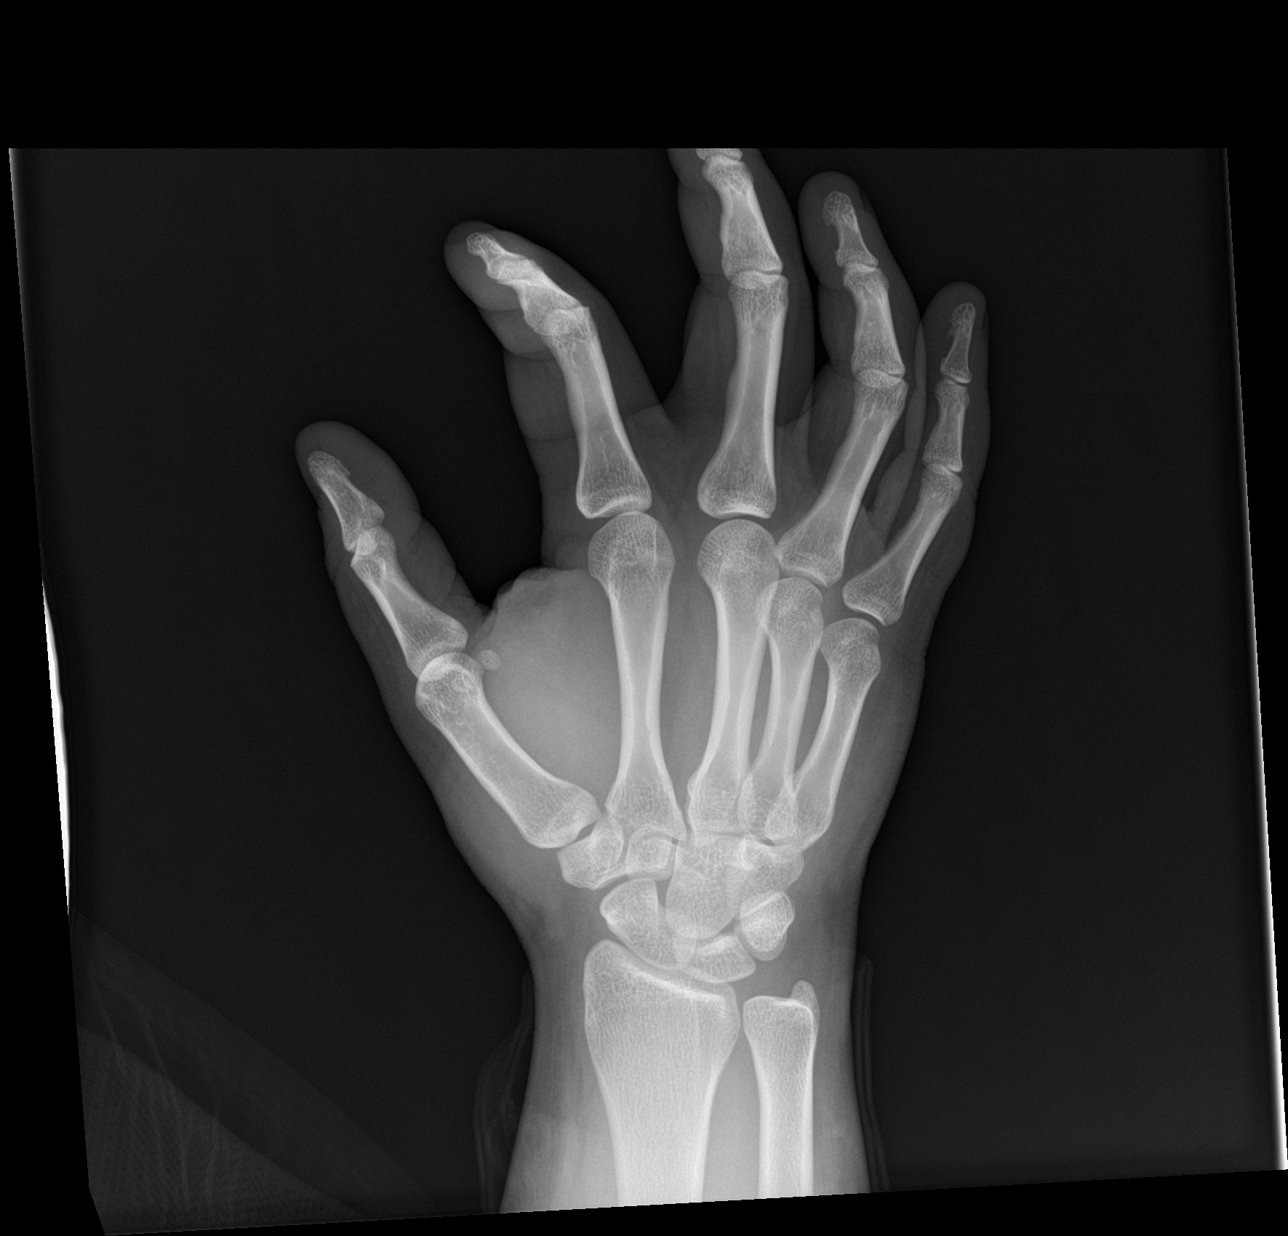

[hand lat]
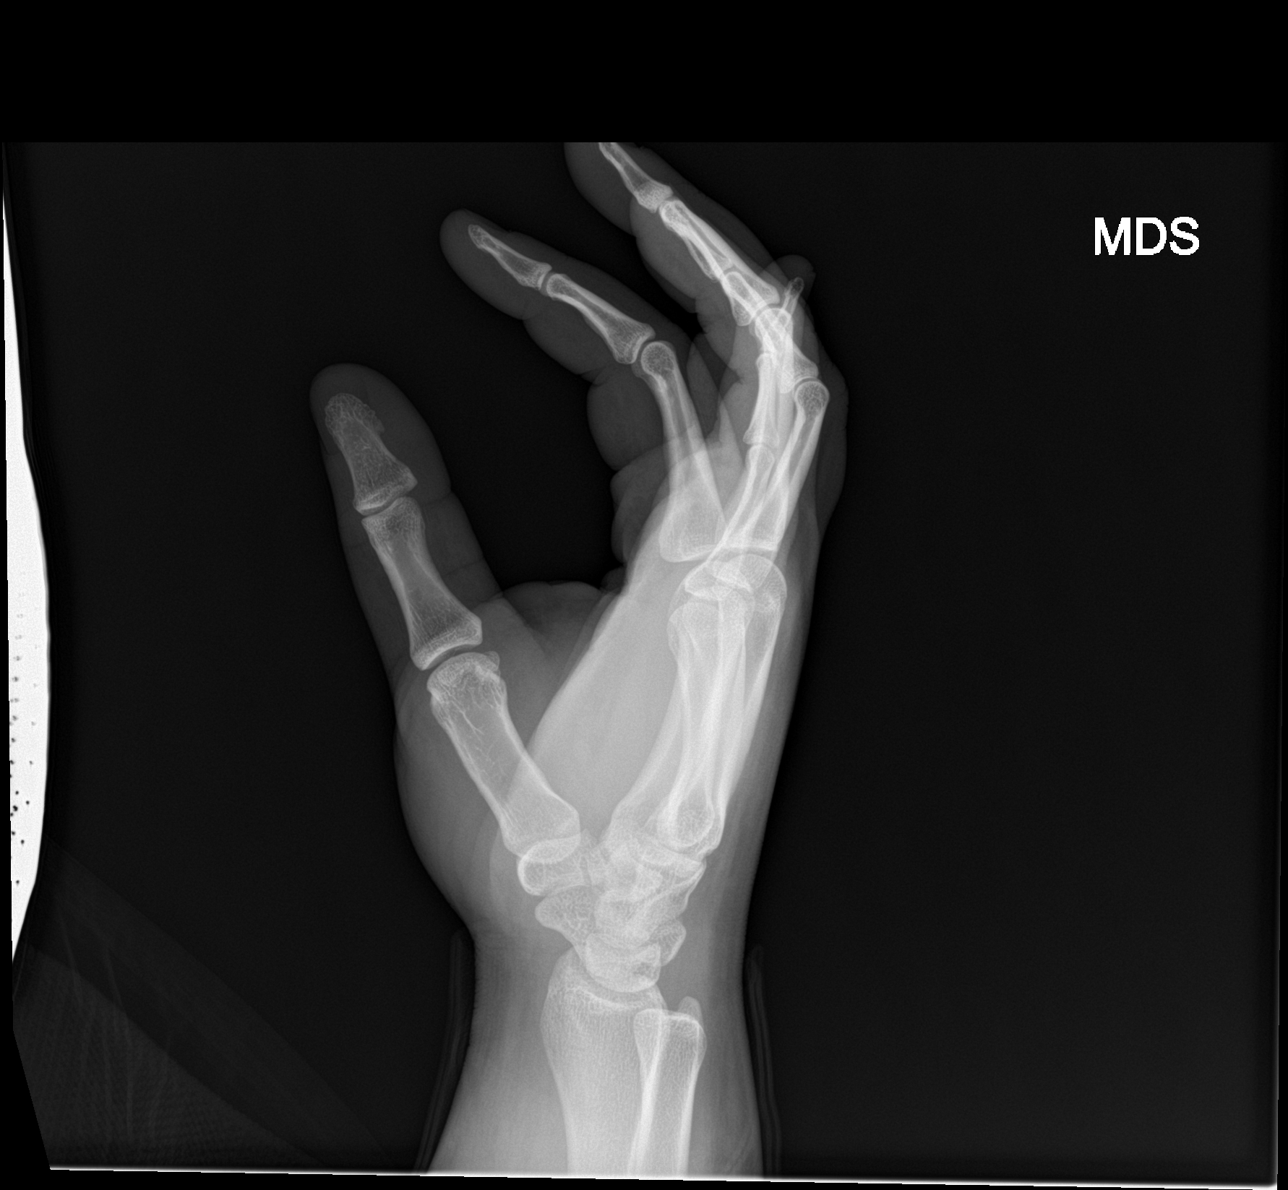

[3 of 3 positions shown; findings below may reference images not displayed]

FINDINGS: Osseous mineralization normal.

Joint spaces preserved.

No acute fracture, dislocation, or bone destruction.
IMPRESSION: No acute osseous abnormalities.

## 2018-05-06 ENCOUNTER — Emergency Department
Admission: EM | Admit: 2018-05-06 | Discharge: 2018-05-06 | Disposition: A | Discharge status: Home / Self Care | Payer: Medicaid Other | Attending: Emergency Medicine | Admitting: Emergency Medicine

## 2018-05-06 ENCOUNTER — Other Ambulatory Visit: Payer: Self-pay

## 2018-05-06 ENCOUNTER — Emergency Department: Payer: Medicaid Other

## 2018-05-06 DIAGNOSIS — R51 Headache: Secondary | ICD-10-CM | POA: Insufficient documentation

## 2018-05-06 DIAGNOSIS — B373 Candidiasis of vulva and vagina: Secondary | ICD-10-CM | POA: Insufficient documentation

## 2018-05-06 DIAGNOSIS — R1030 Lower abdominal pain, unspecified: Secondary | ICD-10-CM | POA: Insufficient documentation

## 2018-05-06 DIAGNOSIS — J45909 Unspecified asthma, uncomplicated: Secondary | ICD-10-CM | POA: Insufficient documentation

## 2018-05-06 DIAGNOSIS — B3731 Acute candidiasis of vulva and vagina: Secondary | ICD-10-CM

## 2018-05-06 DIAGNOSIS — Z79899 Other long term (current) drug therapy: Secondary | ICD-10-CM | POA: Insufficient documentation

## 2018-05-06 LAB — CBC
HEMATOCRIT: 36.6 % (ref 35.0–47.0)
HEMOGLOBIN: 12.1 g/dL (ref 12.0–16.0)
MCH: 26.7 pg (ref 26.0–34.0)
MCHC: 32.9 g/dL (ref 32.0–36.0)
MCV: 81.2 fL (ref 80.0–100.0)
Platelets: 319 10*3/uL (ref 150–440)
RBC: 4.51 MIL/uL (ref 3.80–5.20)
RDW: 14.4 % (ref 11.5–14.5)
WBC: 8.4 10*3/uL (ref 3.6–11.0)

## 2018-05-06 LAB — URINALYSIS, COMPLETE (UACMP) WITH MICROSCOPIC
Bacteria, UA: NONE SEEN
Bilirubin Urine: NEGATIVE
GLUCOSE, UA: NEGATIVE mg/dL
KETONES UR: NEGATIVE mg/dL
LEUKOCYTES UA: NEGATIVE
Nitrite: NEGATIVE
PROTEIN: NEGATIVE mg/dL
Specific Gravity, Urine: 1.017 (ref 1.005–1.030)
pH: 6 (ref 5.0–8.0)

## 2018-05-06 LAB — TROPONIN I: Troponin I: <0.03 ng/mL (ref <0.03)

## 2018-05-06 LAB — BASIC METABOLIC PANEL
ANION GAP: 9 (ref 5–15)
BUN: 11 mg/dL (ref 6–20)
CHLORIDE: 104 mmol/L (ref 101–111)
CO2: 23 mmol/L (ref 22–32)
Calcium: 8.9 mg/dL (ref 8.9–10.3)
Creatinine, Ser: 0.85 mg/dL (ref 0.44–1.00)
GFR calc Af Amer: >60 mL/min (ref >60)
GFR calc non Af Amer: >60 mL/min (ref >60)
GLUCOSE: 97 mg/dL (ref 65–99)
POTASSIUM: 3.7 mmol/L (ref 3.5–5.1)
Sodium: 136 mmol/L (ref 135–145)

## 2018-05-06 LAB — LIPASE, BLOOD: LIPASE: 31 U/L (ref 11–51)

## 2018-05-06 LAB — WET PREP, GENITAL
CLUE CELLS WET PREP: NONE SEEN
Sperm: NONE SEEN
Trich, Wet Prep: NONE SEEN

## 2018-05-06 LAB — CHLAMYDIA/NGC RT PCR (ARMC ONLY)
CHLAMYDIA TR: NOT DETECTED
N GONORRHOEAE: NOT DETECTED

## 2018-05-06 LAB — POCT PREGNANCY, URINE: PREG TEST UR: NEGATIVE

## 2018-05-06 LAB — PREGNANCY, URINE: Preg Test, Ur: NEGATIVE

## 2018-05-06 MED ORDER — FLUCONAZOLE 50 MG PO TABS
150.0000 mg | ORAL_TABLET | Freq: Once | ORAL | Status: AC
  Administered 2018-05-06: 150 mg via ORAL
  Filled 2018-05-06: qty 3

## 2018-05-06 NOTE — ED Provider Notes (Signed)
[facility] Emergency Department Provider Note  Time seen: 2:48 PM  I have reviewed the triage vital signs and the nursing notes.   HISTORY  Chief Complaint Abdominal Pain and Chest Pain    HPI Jacqueline Fletcher is a 27 y.o. female with a past medical history of bipolar, schizophrenia, presents to the emergency department for abdominal discomfort.  According to the patient for the past 2 days she has been experiencing some lower abdominal discomfort.  Earlier today she is also experiencing some chest pain.  Yesterday she was experiencing a headache.  Denies any fever.  Denies any dysuria or hematuria.  Patient has Implanon birth control, but states she noticed some blood when she wipes she is not sure if it was in her urine or vaginal.  Denies any diarrhea.  Denies black or bloody stool.  Patient describes her abdominal pain is mild dull pain across her entire lower abdomen.   Past Medical History:  Diagnosis Date  . Asthma   . Bipolar I disorder, current or most recent episode depressed, with psychotic features (HCC)   . Schizophrenia (HCC)   . Sleep apnea     Patient Active Problem List   Diagnosis Date Noted  . Bipolar I disorder, current or most recent episode depressed, with psychotic features (HCC) 07/07/2017    Past Surgical History:  Procedure Laterality Date  . c-section    . TONSILLECTOMY      Prior to Admission medications   Medication Sig Start Date End Date Taking? Authorizing Provider  albuterol (PROVENTIL HFA;VENTOLIN HFA) 108 (90 Base) MCG/ACT inhaler Inhale 2 puffs into the lungs every 4 (four) hours as needed for wheezing or shortness of breath. 01/03/18   Cuthriell, Delorise Royals, PA-C  fluvoxaMINE (LUVOX) 100 MG tablet Take 1 tablet (100 mg total) by mouth at bedtime. 07/10/17   Pucilowska, Jolanta B, MD  risperiDONE (RISPERDAL) 2 MG tablet Take 1 tablet (2 mg total) by mouth 2 (two) times daily. 07/10/17   Pucilowska, Braulio Conte B, MD  traZODone  (DESYREL) 100 MG tablet Take 1 tablet (100 mg total) by mouth at bedtime. 07/10/17   Pucilowska, Ellin Goodie, MD    Allergies  Allergen Reactions  . Shellfish Allergy     History reviewed. No pertinent family history.  Social History Social History   Tobacco Use  . Smoking status: Never Smoker  . Smokeless tobacco: Never Used  Substance Use Topics  . Alcohol use: Yes    Alcohol/week: 1.2 oz    Types: 2 Glasses of wine per week  . Drug use: No    Review of Systems Constitutional: Negative for fever. Eyes: Negative for visual complaints ENT: Negative for recent illness/congestion Cardiovascular: Chest pain earlier today, since resolved Respiratory: Negative for shortness of breath. Gastrointestinal: Mild dull abdominal pain across the lower abdomen Genitourinary: Negative for urinary compaints.  Possible vaginal bleeding Musculoskeletal: Negative for musculoskeletal complaints Skin: Negative for skin complaints  Neurological: Negative for headache All other ROS negative  ____________________________________________   PHYSICAL EXAM:  VITAL SIGNS: ED Triage Vitals  Enc Vitals Group     BP 05/06/18 1238 (!) 118/52     Pulse Rate 05/06/18 1238 (!) 103     Resp 05/06/18 1238 18     Temp 05/06/18 1238 98.6 F (37 C)     Temp Source 05/06/18 1238 Oral     SpO2 05/06/18 1238 99 %     Weight 05/06/18 1236 240 lb (108.9 kg)  Height 05/06/18 1236 5\' 2"  (1.575 m)     Head Circumference --      Peak Flow --      Pain Score 05/06/18 1235 6     Pain Loc --      Pain Edu? --      Excl. in GC? --    Constitutional: Alert and oriented. Well appearing and in no distress. Eyes: Normal exam ENT   Head: Normocephalic and atraumatic.   Mouth/Throat: Mucous membranes are moist. Cardiovascular: Normal rate, regular rhythm. No murmur Respiratory: Normal respiratory effort without tachypnea nor retractions. Breath sounds are clear  Gastrointestinal: Soft, slight suprapubic  tenderness otherwise nontender abdomen.  No rebound or guarding.  No distention. Musculoskeletal: Nontender with normal range of motion in all extremities.  Neurologic:  Normal speech and language. No gross focal neurologic deficits  Skin:  Skin is warm, dry and intact.  Psychiatric: Mood and affect are normal.   ____________________________________________    EKG  EKG reviewed and interpreted by myself shows sinus tachycardia 106 bpm with a narrow QRS, normal axis, normal intervals, no concerning ST changes.  ____________________________________________    RADIOLOGY  Chest x-ray negative  ____________________________________________   INITIAL IMPRESSION / ASSESSMENT AND PLAN / ED COURSE  Pertinent labs & imaging results that were available during my care of the patient were reviewed by me and considered in my medical decision making (see chart for details).  Patient presents the emergency department with multiple complaints, lower abdominal pain ongoing for 2 days, headache yesterday, chest pain earlier today.  Patient's only active complaint at this time is mild lower abdominal discomfort.  Patient has minimal suprapubic tenderness on exam.  Patient's labs are largely normal including a normal white blood cell count.  Normal urinalysis.  Patient states she was having some vaginal bleeding/spotting or possible hematuria.  Given the urinalysis is normal this was likely vaginal.  Patient has Implanon and does not have a menstrual cycle.  Denies being sexually active at this time.  Given her normal lab and largely benign abdomen besides mild suprapubic tenderness I discussed with the patient follow-up with her doctor in several days for recheck versus performing a pelvic examination, patient wishes for the pelvic examination as a precaution.  We will send swabs and continue close monitor the patient.  Overall she appears well, no distress, watching TV, comfortable lying in bed.  Wet prep  positive for yeast infection.  Does not believe she is at risk for STDs.  We will dose Diflucan in the emergency department.  We will discharge with PCP follow-up.  I discussed return precautions for any fever or worsening abdominal discomfort.  Patient agreeable to plan of care.  ____________________________________________   FINAL CLINICAL IMPRESSION(S) / ED DIAGNOSES  Lower abdominal discomfort Headache Yeast infection   Minna AntisPaduchowski, Graeson Nouri, MD 05/06/18 1605

## 2018-05-06 NOTE — ED Notes (Signed)
Pt is being discharge to home. Pt is AOx4, VSS, she denies any chest pain, shortness of breath or syncope. AVS was given and explained to pt and she verbalized understanding of each. Pt was given work/school note.

## 2018-05-06 NOTE — ED Triage Notes (Signed)
Pt states she got sick at work yesterday (vomiting and HA). C/o back pain, chest pain, abd pain. Central CP at sternum, abd pain lower R and lower L. States lower back pain as well. States sharp in pain. Alert, oriented, ambulatory. No distress noted.

## 2018-06-24 ENCOUNTER — Emergency Department: Payer: Self-pay

## 2018-06-24 ENCOUNTER — Encounter: Payer: Self-pay | Admitting: Emergency Medicine

## 2018-06-24 ENCOUNTER — Emergency Department
Admission: EM | Admit: 2018-06-24 | Discharge: 2018-06-24 | Disposition: A | Discharge status: Home / Self Care | Payer: Self-pay | Attending: Emergency Medicine | Admitting: Emergency Medicine

## 2018-06-24 DIAGNOSIS — Z79899 Other long term (current) drug therapy: Secondary | ICD-10-CM | POA: Insufficient documentation

## 2018-06-24 DIAGNOSIS — J45909 Unspecified asthma, uncomplicated: Secondary | ICD-10-CM | POA: Insufficient documentation

## 2018-06-24 DIAGNOSIS — R609 Edema, unspecified: Secondary | ICD-10-CM

## 2018-06-24 DIAGNOSIS — R2241 Localized swelling, mass and lump, right lower limb: Secondary | ICD-10-CM | POA: Insufficient documentation

## 2018-06-24 DIAGNOSIS — R600 Localized edema: Secondary | ICD-10-CM

## 2018-06-24 DIAGNOSIS — M79604 Pain in right leg: Secondary | ICD-10-CM | POA: Insufficient documentation

## 2018-06-24 MED ORDER — FUROSEMIDE 20 MG PO TABS: 20.0000 mg | ORAL_TABLET | Freq: Every day | ORAL | 0 refills | Status: AC

## 2018-06-24 NOTE — Discharge Instructions (Addendum)
Take medication as directed for 10 days.  [facility]  Pick up medication from pharmacy.

## 2018-06-24 NOTE — ED Triage Notes (Signed)
Pt in via [facility] from home with complaints of right leg pain and swelling, increasing over the last three days.  Pt denies any recent injury.  Per EMS, pt ambulatory on scene.  NAD noted at this time.

## 2018-06-24 NOTE — ED Provider Notes (Signed)
[facility] Emergency Department Provider Note   ____________________________________________   First MD Initiated Contact with Patient 06/24/18 1256     (approximate)  I have reviewed the triage vital signs and the nursing notes.   HISTORY  Chief Complaint Leg Pain    HPI Jacqueline Fletcher is a 27 y.o. female patient arrived via EMS complaining of right leg pain and swelling.  Patient complaint has increased over the last 2 days.  Patient denies any provocative incident for complaint.  Patient denies chest pain or shortness of breath.  Patient rates her pain as 8/10.  Patient described the pain is "achy/pressure".  No palliative measures for complaint.   Past Medical History:  Diagnosis Date  . Asthma   . Bipolar I disorder, current or most recent episode depressed, with psychotic features (HCC)   . Schizophrenia (HCC)   . Sleep apnea     Patient Active Problem List   Diagnosis Date Noted  . Bipolar I disorder, current or most recent episode depressed, with psychotic features (HCC) 07/07/2017    Past Surgical History:  Procedure Laterality Date  . c-section    . TONSILLECTOMY      Prior to Admission medications   Medication Sig Start Date End Date Taking? Authorizing Provider  albuterol (PROVENTIL HFA;VENTOLIN HFA) 108 (90 Base) MCG/ACT inhaler Inhale 2 puffs into the lungs every 4 (four) hours as needed for wheezing or shortness of breath. 01/03/18   Cuthriell, Delorise Royals, PA-C  fluvoxaMINE (LUVOX) 100 MG tablet Take 1 tablet (100 mg total) by mouth at bedtime. 07/10/17   Pucilowska, Braulio Conte B, MD  furosemide (LASIX) 20 MG tablet Take 1 tablet (20 mg total) by mouth daily. 06/24/18 06/24/19  Joni Reining, PA-C  risperiDONE (RISPERDAL) 2 MG tablet Take 1 tablet (2 mg total) by mouth 2 (two) times daily. 07/10/17   Pucilowska, Braulio Conte B, MD  traZODone (DESYREL) 100 MG tablet Take 1 tablet (100 mg total) by mouth at bedtime. 07/10/17   Pucilowska,  Ellin Goodie, MD    Allergies Shellfish allergy  No family history on file.  Social History Social History   Tobacco Use  . Smoking status: Never Smoker  . Smokeless tobacco: Never Used  Substance Use Topics  . Alcohol use: Not Currently  . Drug use: No    Review of Systems  Constitutional: No fever/chills Eyes: No visual changes. ENT: No sore throat. Cardiovascular: Denies chest pain. Respiratory: Denies shortness of breath. Gastrointestinal: No abdominal pain.  No nausea, no vomiting.  No diarrhea.  No constipation. Genitourinary: Negative for dysuria. Musculoskeletal: Right leg pain. Skin: Negative for rash. Neurological: Negative for headaches, focal weakness or numbness. Psychiatric:Bipolar and schizophrenia. Allergic/Immunilogical: Shellfish allergy. ____________________________________________   PHYSICAL EXAM:  VITAL SIGNS: ED Triage Vitals  Enc Vitals Group     BP 06/24/18 1255 123/79     Pulse Rate 06/24/18 1255 94     Resp 06/24/18 1255 16     Temp 06/24/18 1255 98.4 F (36.9 C)     Temp Source 06/24/18 1255 Oral     SpO2 06/24/18 1255 100 %     Weight 06/24/18 1256 200 lb (90.7 kg)     Height 06/24/18 1256 5\' 2"  (1.575 m)     Head Circumference --      Peak Flow --      Pain Score 06/24/18 1255 8     Pain Loc --      Pain Edu? --  Excl. in GC? --    Constitutional: Alert and oriented. Well appearing and in no acute distress.  Morbid obesity. Neck: No stridor.   Cardiovascular: Normal rate, regular rhythm. Grossly normal heart sounds.  Good peripheral circulation. Respiratory: Normal respiratory effort.  No retractions. Lungs CTAB. Musculoskeletal: Right calf measures 51-1/2 cm versus left calf at 51 cm.  Distal pulses intact.   Neurologic:  Normal speech and language. No gross focal neurologic deficits are appreciated. No gait instability. Skin:  Skin is warm, dry and intact. No rash noted. Psychiatric: Mood and affect are normal. Speech  and behavior are normal.  ____________________________________________   LABS (all labs ordered are listed, but only abnormal results are displayed)  Labs Reviewed - No data to display ____________________________________________  EKG   ____________________________________________  RADIOLOGY  ED MD interpretation:    Official radiology report(s): [place] Venous Img Lower Unilateral Right  Result Date: 06/24/2018 CLINICAL DATA:  27 year old female with a history of right leg edema EXAM: RIGHT LOWER EXTREMITY VENOUS DOPPLER ULTRASOUND TECHNIQUE: Gray-scale sonography with graded compression, as well as color Doppler and duplex ultrasound were performed to evaluate the lower extremity deep venous systems from the level of the common femoral vein and including the common femoral, femoral, profunda femoral, popliteal and calf veins including the posterior tibial, peroneal and gastrocnemius veins when visible. The superficial great saphenous vein was also interrogated. Spectral Doppler was utilized to evaluate flow at rest and with distal augmentation maneuvers in the common femoral, femoral and popliteal veins. COMPARISON:  None. FINDINGS: Contralateral Common Femoral Vein: Respiratory phasicity is normal and symmetric with the symptomatic side. No evidence of thrombus. Normal compressibility. Common Femoral Vein: No evidence of thrombus. Normal compressibility, respiratory phasicity and response to augmentation. Saphenofemoral Junction: No evidence of thrombus. Normal compressibility and flow on color Doppler imaging. Profunda Femoral Vein: No evidence of thrombus. Normal compressibility and flow on color Doppler imaging. Femoral Vein: No evidence of thrombus. Normal compressibility, respiratory phasicity and response to augmentation. Popliteal Vein: No evidence of thrombus. Normal compressibility, respiratory phasicity and response to augmentation. Calf Veins: No evidence of thrombus. Normal  compressibility and flow on color Doppler imaging. Superficial Great Saphenous Vein: No evidence of thrombus. Normal compressibility and flow on color Doppler imaging. Other Findings:  None. IMPRESSION: Sonographic survey of the right lower extremity negative for DVT Electronically Signed   By: Gilmer MorJaime  Wagner D.O.   On: 06/24/2018 13:41    ____________________________________________   PROCEDURES  Procedure(s) performed: None  Procedures  Critical Care performed: No  ____________________________________________   INITIAL IMPRESSION / ASSESSMENT AND PLAN / ED COURSE  As part of my medical decision making, I reviewed the following data within the electronic MEDICAL RECORD NUMBER    Patient presents for increasing right lower leg edema.  Patient denies shortness of breath or chest pain.  Differential consist of DVT, cellulitis,  and peripheral edema.  Discussed negative ultrasound findings with patient.  Patient given discharge care instruction.  Patient advised take medication and [facility].  Patient given a work note for today.      ____________________________________________   FINAL CLINICAL IMPRESSION(S) / ED DIAGNOSES  Final diagnoses:  Mild peripheral edema  Pain of right lower extremity     ED Discharge Orders        Ordered    furosemide (LASIX) 20 MG tablet  Daily     06/24/18 1405       Note:  This document was prepared using Dragon  voice recognition software and may include unintentional dictation errors.    Joni Reining, PA-C 06/24/18 1409    Minna Antis, MD 06/24/18 1530

## 2018-08-22 ENCOUNTER — Encounter: Payer: Self-pay | Admitting: Emergency Medicine

## 2018-08-22 ENCOUNTER — Other Ambulatory Visit: Payer: Self-pay

## 2018-08-22 ENCOUNTER — Emergency Department
Admission: EM | Admit: 2018-08-22 | Discharge: 2018-08-22 | Disposition: A | Discharge status: Home / Self Care | Payer: Medicaid Other | Attending: Emergency Medicine | Admitting: Emergency Medicine

## 2018-08-22 DIAGNOSIS — Z79899 Other long term (current) drug therapy: Secondary | ICD-10-CM | POA: Insufficient documentation

## 2018-08-22 DIAGNOSIS — J45909 Unspecified asthma, uncomplicated: Secondary | ICD-10-CM | POA: Insufficient documentation

## 2018-08-22 DIAGNOSIS — K047 Periapical abscess without sinus: Secondary | ICD-10-CM | POA: Insufficient documentation

## 2018-08-22 MED ORDER — LIDOCAINE VISCOUS HCL 2 % MT SOLN
5.0000 mL | Freq: Four times a day (QID) | OROMUCOSAL | 0 refills | Status: AC | PRN
Start: 2018-08-22 — End: ?

## 2018-08-22 MED ORDER — AMOXICILLIN 500 MG PO CAPS: 500.0000 mg | ORAL_CAPSULE | Freq: Three times a day (TID) | ORAL | 0 refills | Status: DC

## 2018-08-22 MED ORDER — AMOXICILLIN 500 MG PO CAPS
500.0000 mg | ORAL_CAPSULE | Freq: Once | ORAL | Status: AC
  Administered 2018-08-22: 500 mg via ORAL
  Filled 2018-08-22: qty 1

## 2018-08-22 MED ORDER — LIDOCAINE VISCOUS HCL 2 % MT SOLN
15.0000 mL | Freq: Once | OROMUCOSAL | Status: AC
  Administered 2018-08-22: 15 mL via OROMUCOSAL
  Filled 2018-08-22: qty 15

## 2018-08-22 MED ORDER — TRAMADOL HCL 50 MG PO TABS: 50.0000 mg | ORAL_TABLET | Freq: Two times a day (BID) | ORAL | 0 refills | Status: AC | PRN

## 2018-08-22 NOTE — ED Triage Notes (Signed)
Pt reports dental pain on the right side causing pain to right ear x 1.  Hurts to chew on the right side and is painful to talk.  Pt does not have a dentist.

## 2018-08-22 NOTE — ED Provider Notes (Signed)
[facility] Emergency Department Provider Note   ____________________________________________   First MD Initiated Contact with Patient 08/22/18 1619     (approximate)  I have reviewed the triage vital signs and the nursing notes.   HISTORY  Chief Complaint Dental Pain     HPI Jacqueline Fletcher is a 27 y.o. female  patient complained of dental pain for 1 week.  Patient state hurts to chew on the affected side.  Patient rates the pain as a 9/10.  Patient described the pain is "aching".  No palliative measure for complaint.   Past Medical History:  Diagnosis Date  . Asthma   . Bipolar I disorder, current or most recent episode depressed, with psychotic features (HCC)   . Schizophrenia (HCC)   . Sleep apnea     Patient Active Problem List   Diagnosis Date Noted  . Bipolar I disorder, current or most recent episode depressed, with psychotic features (HCC) 07/07/2017    Past Surgical History:  Procedure Laterality Date  . c-section    . TONSILLECTOMY      Prior to Admission medications   Medication Sig Start Date End Date Taking? Authorizing Provider  albuterol (PROVENTIL HFA;VENTOLIN HFA) 108 (90 Base) MCG/ACT inhaler Inhale 2 puffs into the lungs every 4 (four) hours as needed for wheezing or shortness of breath. 01/03/18   Cuthriell, Delorise RoyalsJonathan D, PA-C  amoxicillin (AMOXIL) 500 MG capsule Take 1 capsule (500 mg total) by mouth 3 (three) times daily. 08/22/18   Joni ReiningSmith, Ronald K, PA-C  fluvoxaMINE (LUVOX) 100 MG tablet Take 1 tablet (100 mg total) by mouth at bedtime. 07/10/17   Pucilowska, Braulio ConteJolanta B, MD  furosemide (LASIX) 20 MG tablet Take 1 tablet (20 mg total) by mouth daily. 06/24/18 06/24/19  Joni ReiningSmith, Ronald K, PA-C  lidocaine (XYLOCAINE) 2 % solution Use as directed 5 mLs in the mouth or throat every 6 (six) hours as needed for mouth pain. For oral swish. 08/22/18   Joni ReiningSmith, Ronald K, PA-C  risperiDONE (RISPERDAL) 2 MG tablet Take 1 tablet (2 mg total) by  mouth 2 (two) times daily. 07/10/17   Pucilowska, Braulio ConteJolanta B, MD  traMADol (ULTRAM) 50 MG tablet Take 1 tablet (50 mg total) by mouth every 12 (twelve) hours as needed. 08/22/18   Joni ReiningSmith, Ronald K, PA-C  traZODone (DESYREL) 100 MG tablet Take 1 tablet (100 mg total) by mouth at bedtime. 07/10/17   Shari ProwsPucilowska, Jolanta B, MD    Allergies Shellfish allergy  History reviewed. No pertinent family history.  Social History Social History   Tobacco Use  . Smoking status: Never Smoker  . Smokeless tobacco: Never Used  Substance Use Topics  . Alcohol use: Not Currently  . Drug use: No    Review of Systems Constitutional: No fever/chills Eyes: No visual changes. ENT: No sore throat.  Dental pain. Cardiovascular: Denies chest pain. Respiratory: Denies shortness of breath. Gastrointestinal: No abdominal pain.  No nausea, no vomiting.  No diarrhea.  No constipation. Genitourinary: Negative for dysuria. Musculoskeletal: Negative for back pain. Skin: Negative for rash. Neurological: Negative for headaches, focal weakness or numbness. Psychiatric:Bipolar and schizophrenia. llergic/Immunilogical: Shellfish. ____________________________________________   PHYSICAL EXAM:  VITAL SIGNS: ED Triage Vitals  Enc Vitals Group     BP 08/22/18 1608 108/76     Pulse Rate 08/22/18 1608 92     Resp 08/22/18 1608 18     Temp 08/22/18 1608 99.1 F (37.3 C)     Temp Source 08/22/18 1608 Oral  SpO2 08/22/18 1608 98 %     Weight 08/22/18 1608 240 lb (108.9 kg)     Height 08/22/18 1608 5\' 2"  (1.575 m)     Head Circumference --      Peak Flow --      Pain Score 08/22/18 1609 9     Pain Loc --      Pain Edu? --      Excl. in GC? --   Constitutional: Alert and oriented. Well appearing and in no acute distress.  Morbid obesity. Mouth/Throat: Mucous membranes are moist.  Oropharynx non-erythematous.  Gingiva edema and devitalized tooth numbers 29 and 30. Neck: No stridor.   Cardiovascular: Normal rate,  regular rhythm. Grossly normal heart sounds.  Good peripheral circulation. Respiratory: Normal respiratory effort.  No retractions. Lungs CTAB.   ____________________________________________   LABS (all labs ordered are listed, but only abnormal results are displayed)  Labs Reviewed - No data to display ____________________________________________  EKG   ____________________________________________  RADIOLOGY  ED MD interpretation:    Official radiology report(s): No results found.  ____________________________________________   PROCEDURES  Procedure(s) performed: None  Procedures  Critical Care performed: No  ____________________________________________   INITIAL IMPRESSION / ASSESSMENT AND PLAN / ED COURSE  As part of my medical decision making, I reviewed the following data within the electronic MEDICAL RECORD NUMBER    Dental pain secondary gingivitis and dental caries.  Patient given discharge care instruction advised take medication as directed.  [facility]   ____________________________________________   FINAL CLINICAL IMPRESSION(S) / ED DIAGNOSES  Final diagnoses:  Dental infection     ED Discharge Orders         Ordered    amoxicillin (AMOXIL) 500 MG capsule  3 times daily     08/22/18 1639    traMADol (ULTRAM) 50 MG tablet  Every 12 hours PRN     08/22/18 1639    lidocaine (XYLOCAINE) 2 % solution  Every 6 hours PRN     08/22/18 1644           Note:  This document was prepared using Dragon voice recognition software and may include unintentional dictation errors.    Joni Reining, PA-C 08/22/18 1647    Jeanmarie Plant, MD 08/22/18 2005

## 2018-08-22 NOTE — Discharge Instructions (Signed)
Take medication as directed and follow-up with dental clinic.  May also choose from list of dental clinics listed below. OPTIONS FOR DENTAL FOLLOW UP CARE  [place] Department of Health and Human Services - Local Safety Net Dental Clinics TripDoors.comhttp://www.ncdhhs.gov/dph/oralhealth/services/safetynetclinics.htm   [facility] [phone])  [facility] [phone])  [facility] [phone]  [facility] [phone])  [facility] [phone]) This clinic caters to the indigent population and is on a lottery system. Location: [employer] [address], [address], [place] [facility] Hours: Wednesdays from 6pm - 9pm, patients seen by a lottery system. For dates, call or go to [email or web address] Services: Cleanings, fillings and simple extractions. Payment Options: DENTAL WORK IS FREE OF CHARGE. Bring proof of income or support. Best way to get seen: Arrive at 5:15 pm - this is a lottery, NOT first come/first serve, so arriving earlier will not increase your chances of being seen.     [facility] [phone] Select option 1 for emergencies   Location: [facility], [address], [address], [place] [facility] Hours: No walk-ins accepted - call the day before to schedule an appointment. Check in times are 9:30 am and 1:30 pm. Services: Simple extractions, temporary fillings, pulpectomy/pulp debridement, uncomplicated abscess drainage. Payment Options: PAYMENT IS DUE AT THE TIME OF SERVICE.  Fee is usually $100-200, additional surgical procedures (e.g. abscess drainage) may be extra. Cash, checks, Visa/MasterCard accepted.  Can file Medicaid if patient is covered for dental - patient should call case worker to check. No discount for [facility] patients. Best way to get seen: MUST call the day before and get onto the schedule.  Can usually be seen the next 1-2 days. No walk-ins accepted.     [facility] [phone]   Location: [facility], [address], [place] [facility] Hours: M, W, Th, F 8am or 1:30pm, Tues 9a or 1:30 - first come/first served. Services: Simple extractions, temporary fillings, uncomplicated abscess drainage.  You do not need to be an [place] resident. Payment Options: PAYMENT IS DUE AT THE TIME OF SERVICE. Dental insurance, otherwise sliding scale - bring proof of income or support. Depending on income and treatment needed, cost is usually $50-200. Best way to get seen: Arrive early as it is first come/first served.     [facility] [phone]   Location: [address] Clinic Hours: Mon-Thu 8a-5p Services: Most basic dental services including extractions and fillings. Payment Options: PAYMENT IS DUE AT THE TIME OF SERVICE. Sliding scale, up to 50% off - bring proof if income or support. Medicaid with dental option accepted. Best way to get seen: Call to schedule an appointment, can usually be seen within 2 weeks OR they will try to see walk-ins - show up at 8a or 2p (you may have to wait).     [facility] [phone] [place] RESIDENTS ONLY   Location: [facility], [address], [place], [place] [id number] Clinic Hours: By appointment only. Monday - Thursday 8am-5pm, Friday 8am-12pm Services: Cleanings, fillings, extractions. Payment Options: PAYMENT IS DUE AT THE TIME OF SERVICE. Cash, Visa or MasterCard. Sliding scale - $30 minimum per service. Best way to get seen: Come in to office, complete packet and make an appointment - need proof of income or support monies for each household member and proof of [place] residence. Usually takes about a  month to get in.     [facility] [phone]    Location: [address] [place] Clinic Hours: Walk-in Urgent Care Dental Services are offered Monday-Friday mornings only. The numbers of emergencies accepted daily is limited to the number of providers available. Maximum 15 - Mondays, Wednesdays & Thursdays Maximum 10 - Tuesdays & Fridays Services: You do not need to be a [facility] resident to be seen for a dental emergency. Emergencies are defined as pain, swelling, abnormal bleeding, or dental trauma. Walkins will receive x-rays if needed. NOTE: Dental cleaning is not an emergency. Payment Options: PAYMENT IS DUE AT THE TIME OF SERVICE. Minimum co-pay is $40.00 for uninsured patients. Minimum co-pay is $3.00 for Medicaid with dental coverage. Dental Insurance is accepted and must be presented at time of visit. Medicare does not cover dental. Forms of payment: Cash, credit card, checks. Best way to get seen: If not previously registered with the clinic, walk-in dental registration begins at 7:15 am and is on a first come/first serve basis. If previously registered with the clinic, call to make an appointment.     [facility] [place] ONLY   Location: [address] [place], [place] Clinic Hours: Mon-Thu 10a-2p Services: Extractions only! Payment Options: FREE (donations accepted) - bring proof of income or support Best way to get seen: Call and schedule an appointment OR come at 8am on the 1st Monday of every month (except for holidays) when it is first come/first served.     [facility] [phone]   Location: [place], [place] Clinic Hours: Friday mornings Services, Payment Options, Best way to get seen: Call for info

## 2018-09-21 ENCOUNTER — Emergency Department
Admission: EM | Admit: 2018-09-21 | Discharge: 2018-09-21 | Disposition: A | Discharge status: Home / Self Care | Payer: Medicaid Other | Attending: Emergency Medicine | Admitting: Emergency Medicine

## 2018-09-21 ENCOUNTER — Emergency Department: Payer: Medicaid Other

## 2018-09-21 ENCOUNTER — Encounter: Payer: Self-pay | Admitting: Emergency Medicine

## 2018-09-21 ENCOUNTER — Other Ambulatory Visit: Payer: Self-pay

## 2018-09-21 DIAGNOSIS — Z79899 Other long term (current) drug therapy: Secondary | ICD-10-CM | POA: Diagnosis not present

## 2018-09-21 DIAGNOSIS — M5412 Radiculopathy, cervical region: Secondary | ICD-10-CM | POA: Diagnosis not present

## 2018-09-21 DIAGNOSIS — J45909 Unspecified asthma, uncomplicated: Secondary | ICD-10-CM | POA: Diagnosis not present

## 2018-09-21 DIAGNOSIS — M79602 Pain in left arm: Secondary | ICD-10-CM

## 2018-09-21 LAB — CBC WITH DIFFERENTIAL/PLATELET
Abs Immature Granulocytes: 0.02 10*3/uL (ref 0.00–0.07)
BASOS PCT: 0 %
Basophils Absolute: 0 10*3/uL (ref 0.0–0.1)
EOS ABS: 0.1 10*3/uL (ref 0.0–0.5)
EOS PCT: 2 %
HCT: 39.6 % (ref 36.0–46.0)
Hemoglobin: 12.5 g/dL (ref 12.0–15.0)
Immature Granulocytes: 0 %
Lymphocytes Relative: 25 %
Lymphs Abs: 1.8 10*3/uL (ref 0.7–4.0)
MCH: 26.2 pg (ref 26.0–34.0)
MCHC: 31.6 g/dL (ref 30.0–36.0)
MCV: 83 fL (ref 80.0–100.0)
MONO ABS: 0.6 10*3/uL (ref 0.1–1.0)
Monocytes Relative: 8 %
NRBC: 0 % (ref 0.0–0.2)
Neutro Abs: 4.6 10*3/uL (ref 1.7–7.7)
Neutrophils Relative %: 65 %
PLATELETS: 373 10*3/uL (ref 150–400)
RBC: 4.77 MIL/uL (ref 3.87–5.11)
RDW: 14.3 % (ref 11.5–15.5)
WBC: 7.1 10*3/uL (ref 4.0–10.5)

## 2018-09-21 LAB — COMPREHENSIVE METABOLIC PANEL
ALK PHOS: 70 U/L (ref 38–126)
ALT: 12 U/L (ref 0–44)
ANION GAP: 10 (ref 5–15)
AST: 14 U/L — ABNORMAL LOW (ref 15–41)
Albumin: 3.9 g/dL (ref 3.5–5.0)
BUN: 9 mg/dL (ref 6–20)
CALCIUM: 8.8 mg/dL — AB (ref 8.9–10.3)
CO2: 24 mmol/L (ref 22–32)
Chloride: 106 mmol/L (ref 98–111)
Creatinine, Ser: 0.75 mg/dL (ref 0.44–1.00)
GFR calc non Af Amer: >60 mL/min (ref >60)
Glucose, Bld: 83 mg/dL (ref 70–99)
POTASSIUM: 3.8 mmol/L (ref 3.5–5.1)
SODIUM: 140 mmol/L (ref 135–145)
Total Bilirubin: 0.4 mg/dL (ref 0.3–1.2)
Total Protein: 7.9 g/dL (ref 6.5–8.1)

## 2018-09-21 LAB — TROPONIN I: Troponin I: <0.03 ng/mL (ref <0.03)

## 2018-09-21 MED ORDER — NAPROXEN 500 MG PO TABS: 500.0000 mg | ORAL_TABLET | Freq: Two times a day (BID) | ORAL | 0 refills | Status: DC

## 2018-09-21 MED ORDER — KETOROLAC TROMETHAMINE 30 MG/ML IJ SOLN
30.0000 mg | Freq: Once | INTRAMUSCULAR | Status: AC
  Administered 2018-09-21: 30 mg via INTRAMUSCULAR
  Filled 2018-09-21: qty 1

## 2018-09-21 NOTE — Discharge Instructions (Addendum)
Follow-up with your primary care provider or [facility] acute care if any continued problems.  Begin taking naproxen 500 mg twice daily for at least 7 days with food.  Use moist heat or ice to your shoulder and neck as needed for discomfort.  Return to the emergency department if any severe worsening of your symptoms such as inability to move your arm or feel your fingers.

## 2018-09-21 NOTE — ED Triage Notes (Signed)
Awoke with L arm pain approx 1 am. Hurts with movement and has limited ROM. Denies injury.

## 2018-09-21 NOTE — ED Provider Notes (Signed)
[facility] Emergency Department Provider Note   ____________________________________________   First MD Initiated Contact with Patient 09/21/18 1149     (approximate)  I have reviewed the triage vital signs and the nursing notes.   HISTORY  Chief Complaint Arm Pain   HPI Jacqueline Fletcher is a 27 y.o. female presents to the ED via EMS with complaint of left arm pain that started approximately 1 AM.  Patient denies any recent injury.  She states that pain is limited secondary to increased pain.  She states that pain radiates from her left shoulder down into her arm.  She denies any chest pain or shortness of breath.  There is been no nausea or vomiting.  Patient denies any previous heart problems and there is been no use of recreational drugs.  Patient is non-smoker.  She does have a history of bipolar, schizophrenia and sleep apnea.  She denies any history of hypertension or cardiac disease.  She rates her pain as 9/10.   Past Medical History:  Diagnosis Date  . Asthma   . Bipolar I disorder, current or most recent episode depressed, with psychotic features (HCC)   . Schizophrenia (HCC)   . Sleep apnea     Patient Active Problem List   Diagnosis Date Noted  . Bipolar I disorder, current or most recent episode depressed, with psychotic features (HCC) 07/07/2017    Past Surgical History:  Procedure Laterality Date  . c-section    . TONSILLECTOMY      Prior to Admission medications   Medication Sig Start Date End Date Taking? Authorizing Provider  albuterol (PROVENTIL HFA;VENTOLIN HFA) 108 (90 Base) MCG/ACT inhaler Inhale 2 puffs into the lungs every 4 (four) hours as needed for wheezing or shortness of breath. 01/03/18   Cuthriell, Delorise Royals, PA-C  amoxicillin (AMOXIL) 500 MG capsule Take 1 capsule (500 mg total) by mouth 3 (three) times daily. 08/22/18   Joni Reining, PA-C  fluvoxaMINE (LUVOX) 100 MG tablet Take 1 tablet (100 mg total) by mouth at  bedtime. 07/10/17   Pucilowska, Braulio Conte B, MD  furosemide (LASIX) 20 MG tablet Take 1 tablet (20 mg total) by mouth daily. 06/24/18 06/24/19  Joni Reining, PA-C  lidocaine (XYLOCAINE) 2 % solution Use as directed 5 mLs in the mouth or throat every 6 (six) hours as needed for mouth pain. For oral swish. 08/22/18   Joni Reining, PA-C  naproxen (NAPROSYN) 500 MG tablet Take 1 tablet (500 mg total) by mouth 2 (two) times daily with a meal. 09/21/18   Bridget Hartshorn L, PA-C  risperiDONE (RISPERDAL) 2 MG tablet Take 1 tablet (2 mg total) by mouth 2 (two) times daily. 07/10/17   Pucilowska, Braulio Conte B, MD  traMADol (ULTRAM) 50 MG tablet Take 1 tablet (50 mg total) by mouth every 12 (twelve) hours as needed. 08/22/18   Joni Reining, PA-C  traZODone (DESYREL) 100 MG tablet Take 1 tablet (100 mg total) by mouth at bedtime. 07/10/17   Pucilowska, Ellin Goodie, MD    Allergies Shellfish allergy  No family history on file.  Social History Social History   Tobacco Use  . Smoking status: Never Smoker  . Smokeless tobacco: Never Used  Substance Use Topics  . Alcohol use: Not Currently  . Drug use: No    Review of Systems Constitutional: No fever/chills Cardiovascular: Denies chest pain. Respiratory: Denies shortness of breath. Gastrointestinal: No abdominal pain.  No nausea, no vomiting.  Musculoskeletal: Positive left  arm pain. Skin: Negative for rash. Neurological: Negative for headaches, focal weakness or numbness. Psychiatric:Positive bipolar with psychotic features, schizophrenia. ____________________________________________   PHYSICAL EXAM:  VITAL SIGNS: ED Triage Vitals  Enc Vitals Group     BP 09/21/18 1138 134/73     Pulse Rate 09/21/18 1138 (!) 102     Resp 09/21/18 1138 18     Temp 09/21/18 1138 98.1 F (36.7 C)     Temp Source 09/21/18 1138 Oral     SpO2 09/21/18 1138 100 %     Weight 09/21/18 1140 230 lb (104.3 kg)     Height 09/21/18 1140 5\' 2"  (1.575 m)     Head  Circumference --      Peak Flow --      Pain Score 09/21/18 1140 9     Pain Loc --      Pain Edu? --      Excl. in GC? --     Constitutional: Alert and oriented. Well appearing and in no acute distress. Eyes: Conjunctivae are normal. PERRL. EOMI. Head: Atraumatic. Nose: No congestion/rhinnorhea. Neck: No stridor.  No cervical tenderness on palpation posteriorly. Cardiovascular: Normal rate, regular rhythm. Grossly normal heart sounds.  Good peripheral circulation. Respiratory: Normal respiratory effort.  No retractions. Lungs CTAB. Musculoskeletal: There is moderate tenderness on palpation of the left trapezius muscle and into the rhomboid.  Range of motion is decreased secondary to pain.  Patient is unable to abduct her upper extremity secondary to pain.  Minimal crepitus is noted.  No soft tissue abrasion or injury is appreciated.  No edema.  Diffuse tenderness is noted on palpation from the trapezius muscle down to the forearm.  Pulses present.  Capillary refill is less than 3 seconds. Neurologic:  Normal speech and language. No gross focal neurologic deficits are appreciated. No gait instability. Skin:  Skin is warm, dry and intact. No rash or discoloration noted. Psychiatric: Mood and affect are normal. Speech and behavior are normal.  ____________________________________________   LABS (all labs ordered are listed, but only abnormal results are displayed)  Labs Reviewed  COMPREHENSIVE METABOLIC PANEL - Abnormal; Notable for the following components:      Result Value   Calcium 8.8 (*)    AST 14 (*)    All other components within normal limits  CBC WITH DIFFERENTIAL/PLATELET  TROPONIN I    RADIOLOGY  ED MD interpretation:   Chest x-ray is negative for acute changes.  Official radiology report(s): Dg Chest 2 View  Result Date: 09/21/2018 CLINICAL DATA:  Chest and upper extremity pain EXAM: CHEST - 2 VIEW COMPARISON:  May 06, 2018 FINDINGS: Lungs are clear. Heart size  and pulmonary vascularity are normal. No pneumothorax. No adenopathy. No bone lesions. IMPRESSION: No edema or consolidation. Electronically Signed   By: Bretta Bang III M.D.   On: 09/21/2018 13:14    ____________________________________________   PROCEDURES  Procedure(s) performed: None  Procedures  Critical Care performed: No  ____________________________________________   INITIAL IMPRESSION / ASSESSMENT AND PLAN / ED COURSE  As part of my medical decision making, I reviewed the following data within the electronic MEDICAL RECORD NUMBER Notes from prior ED visits and [place] Controlled Substance Database  Patient presents to the ED via EMS with complaint of left arm pain that began at 1 AM.  Patient denies any injury.  She also denies any chest pain, shortness of breath, nausea, vomiting or previous chest pain and cardiac disease.  Patient EKG and troponin were reassuring.  Chest x-ray was negative.  Patient was reassured that this is most likely a muscle skeletal issue and not cardiac.  After ruling out cardiac reasons patient was given Toradol 30 mg IM.  She is to continue with naproxen 500 mg twice daily with food.  She is encouraged to follow-up with her PCP or [facility] if any continued problems.  She is encouraged to return to the emergency department if any severe worsening of her symptoms.  ____________________________________________   FINAL CLINICAL IMPRESSION(S) / ED DIAGNOSES  Final diagnoses:  Cervical radiculopathy  Left arm pain     ED Discharge Orders         Ordered    naproxen (NAPROSYN) 500 MG tablet  2 times daily with meals     09/21/18 1356           Note:  This document was prepared using Dragon voice recognition software and may include unintentional dictation errors.    Tommi Rumps, PA-C 09/21/18 1559    Minna Antis, MD 09/22/18 2056

## 2018-09-21 NOTE — ED Provider Notes (Signed)
-----------------------------------------   12:20 PM on 09/21/2018 -----------------------------------------  EKG reviewed and interpreted by myself shows normal sinus rhythm at 93 bpm with a narrow QRS, normal axis, normal intervals, no ST changes.  Normal EKG.   Minna Antis, MD 09/21/18 1220

## 2018-11-07 ENCOUNTER — Other Ambulatory Visit: Payer: Self-pay

## 2018-11-07 ENCOUNTER — Encounter: Payer: Self-pay | Admitting: Emergency Medicine

## 2018-11-07 ENCOUNTER — Emergency Department: Payer: Medicaid Other

## 2018-11-07 ENCOUNTER — Emergency Department
Admission: EM | Admit: 2018-11-07 | Discharge: 2018-11-07 | Disposition: A | Discharge status: Home / Self Care | Payer: Medicaid Other | Attending: Emergency Medicine | Admitting: Emergency Medicine

## 2018-11-07 DIAGNOSIS — Z79899 Other long term (current) drug therapy: Secondary | ICD-10-CM | POA: Insufficient documentation

## 2018-11-07 DIAGNOSIS — R079 Chest pain, unspecified: Secondary | ICD-10-CM | POA: Diagnosis not present

## 2018-11-07 DIAGNOSIS — J45909 Unspecified asthma, uncomplicated: Secondary | ICD-10-CM | POA: Diagnosis not present

## 2018-11-07 LAB — CBC
HEMATOCRIT: 39.1 % (ref 36.0–46.0)
Hemoglobin: 12.3 g/dL (ref 12.0–15.0)
MCH: 26.5 pg (ref 26.0–34.0)
MCHC: 31.5 g/dL (ref 30.0–36.0)
MCV: 84.3 fL (ref 80.0–100.0)
NRBC: 0 % (ref 0.0–0.2)
Platelets: 369 10*3/uL (ref 150–400)
RBC: 4.64 MIL/uL (ref 3.87–5.11)
RDW: 15.1 % (ref 11.5–15.5)
WBC: 10.7 10*3/uL — AB (ref 4.0–10.5)

## 2018-11-07 LAB — BASIC METABOLIC PANEL
Anion gap: 8 (ref 5–15)
BUN: 11 mg/dL (ref 6–20)
CHLORIDE: 105 mmol/L (ref 98–111)
CO2: 23 mmol/L (ref 22–32)
CREATININE: 0.79 mg/dL (ref 0.44–1.00)
Calcium: 8.7 mg/dL — ABNORMAL LOW (ref 8.9–10.3)
GFR calc non Af Amer: >60 mL/min (ref >60)
Glucose, Bld: 105 mg/dL — ABNORMAL HIGH (ref 70–99)
POTASSIUM: 3.5 mmol/L (ref 3.5–5.1)
SODIUM: 136 mmol/L (ref 135–145)

## 2018-11-07 LAB — TROPONIN I: Troponin I: 0.04 ng/mL (ref <0.03)

## 2018-11-07 LAB — FIBRIN DERIVATIVES D-DIMER (ARMC ONLY): FIBRIN DERIVATIVES D-DIMER (ARMC): 338.85 ng{FEU}/mL (ref 0.00–499.00)

## 2018-11-07 NOTE — ED Provider Notes (Signed)
[facility] Emergency Department Provider Note  ____________________________________________   First MD Initiated Contact with Patient 11/07/18 [phone]     (approximate)  I have reviewed the triage vital signs and the nursing notes.   HISTORY  Chief Complaint Chest Pain    HPI Jacqueline Fletcher is a 27 y.o. female who reports no chronic medical issues except for well-controlled asthma but has medical history as listed below.  She presents by EMS for evaluation of acute onset sharp chest pain primarily on the left side of her chest.  She reports that started yesterday and is intermittent.  No injury of which she is aware.  Nothing in particular makes it better or worse, although she says that initially pushing on it did make it worse (but now it does not).  She also had a mild headache.  She says that it happens sometimes but does not know why.  She has no history of blood clots in the legs of the lungs and nor does anyone in her family.  No history of first-degree relatives with heart attacks.  She has not had any recent operations or immobilizations or long trips.  She does have an implanted birth control device.  She denies shortness of breath, nausea, vomiting, and abdominal pain.  Past Medical History:  Diagnosis Date  . Asthma   . Bipolar I disorder, current or most recent episode depressed, with psychotic features (HCC)   . Schizophrenia (HCC)   . Sleep apnea     Patient Active Problem List   Diagnosis Date Noted  . Bipolar I disorder, current or most recent episode depressed, with psychotic features (HCC) 07/07/2017    Past Surgical History:  Procedure Laterality Date  . c-section    . TONSILLECTOMY      Prior to Admission medications   Medication Sig Start Date End Date Taking? Authorizing Provider  albuterol (PROVENTIL HFA;VENTOLIN HFA) 108 (90 Base) MCG/ACT inhaler Inhale 2 puffs into the lungs every 4 (four) hours as needed for wheezing or  shortness of breath. 01/03/18  Yes Cuthriell, Delorise Royals, PA-C  amoxicillin (AMOXIL) 500 MG capsule Take 1 capsule (500 mg total) by mouth 3 (three) times daily. Patient not taking: Reported on 11/07/2018 08/22/18   Joni Reining, PA-C  fluvoxaMINE (LUVOX) 100 MG tablet Take 1 tablet (100 mg total) by mouth at bedtime. Patient not taking: Reported on 11/07/2018 07/10/17   Pucilowska, Braulio Conte B, MD  furosemide (LASIX) 20 MG tablet Take 1 tablet (20 mg total) by mouth daily. Patient not taking: Reported on 11/07/2018 06/24/18 06/24/19  Joni Reining, PA-C  lidocaine (XYLOCAINE) 2 % solution Use as directed 5 mLs in the mouth or throat every 6 (six) hours as needed for mouth pain. For oral swish. Patient not taking: Reported on 11/07/2018 08/22/18   Joni Reining, PA-C  naproxen (NAPROSYN) 500 MG tablet Take 1 tablet (500 mg total) by mouth 2 (two) times daily with a meal. Patient not taking: Reported on 11/07/2018 09/21/18   Tommi Rumps, PA-C  risperiDONE (RISPERDAL) 2 MG tablet Take 1 tablet (2 mg total) by mouth 2 (two) times daily. Patient not taking: Reported on 11/07/2018 07/10/17   Pucilowska, Ellin Goodie, MD  traMADol (ULTRAM) 50 MG tablet Take 1 tablet (50 mg total) by mouth every 12 (twelve) hours as needed. Patient not taking: Reported on 11/07/2018 08/22/18   Joni Reining, PA-C  traZODone (DESYREL) 100 MG tablet Take 1 tablet (100 mg total) by  mouth at bedtime. Patient not taking: Reported on 11/07/2018 07/10/17   Shari Prows, MD    Allergies Shellfish allergy  No family history on file.  Social History Social History   Tobacco Use  . Smoking status: Never Smoker  . Smokeless tobacco: Never Used  Substance Use Topics  . Alcohol use: Not Currently  . Drug use: No    Review of Systems Constitutional: No fever/chills Eyes: No visual changes. ENT: No sore throat. Cardiovascular: Sharp chest pain as described above Respiratory: Denies shortness of  breath. Gastrointestinal: No abdominal pain.  No nausea, no vomiting.  No diarrhea.  No constipation. Genitourinary: Negative for dysuria. Musculoskeletal: Negative for neck pain.  Negative for back pain. Integumentary: Negative for rash. Neurological: Negative for headaches, focal weakness or numbness.   ____________________________________________   PHYSICAL EXAM:  VITAL SIGNS: ED Triage Vitals  Enc Vitals Group     BP 11/07/18 0210 112/74     Pulse Rate 11/07/18 0210 (!) 104     Resp 11/07/18 0210 18     Temp 11/07/18 0210 98.1 F (36.7 C)     Temp Source 11/07/18 0210 Oral     SpO2 11/07/18 0210 99 %     Weight 11/07/18 0207 104.3 kg (230 lb)     Height 11/07/18 0207 1.575 m (5\' 2" )     Head Circumference --      Peak Flow --      Pain Score 11/07/18 0207 8     Pain Loc --      Pain Edu? --      Excl. in GC? --     Constitutional: Alert and oriented. Well appearing and in no acute distress. Eyes: Conjunctivae are normal.  Head: Atraumatic. Nose: No congestion/rhinnorhea. Mouth/Throat: Mucous membranes are moist. Neck: No stridor.  No meningeal signs.   Cardiovascular: Normal rate, regular rhythm. Good peripheral circulation. Grossly normal heart sounds.  No reproducible tenderness to palpation of the chest wall. Respiratory: Normal respiratory effort.  No retractions. Lungs CTAB. Gastrointestinal: Soft and nontender. No distention.  Musculoskeletal: No lower extremity tenderness nor edema. No gross deformities of extremities. Neurologic:  Normal speech and language. No gross focal neurologic deficits are appreciated.  Skin:  Skin is warm, dry and intact. No rash noted. Psychiatric: Mood and affect are normal. Speech and behavior are normal.  ____________________________________________   LABS (all labs ordered are listed, but only abnormal results are displayed)  Labs Reviewed  BASIC METABOLIC PANEL - Abnormal; Notable for the following components:       Result Value   Glucose, Bld 105 (*)    Calcium 8.7 (*)    All other components within normal limits  CBC - Abnormal; Notable for the following components:   WBC 10.7 (*)    All other components within normal limits  TROPONIN I - Abnormal; Notable for the following components:   Troponin I 0.04 (*)    All other components within normal limits  TROPONIN I  FIBRIN DERIVATIVES D-DIMER ([facility] ONLY)  POC URINE PREG, ED   ____________________________________________  EKG  ED ECG REPORT I, Loleta Rose, the attending physician, personally viewed and interpreted this ECG.  Date: 11/07/2018 EKG Time: 1:59 AM Rate: 103 Rhythm: Mild tachycardia QRS Axis: normal Intervals: normal ST/T Wave abnormalities: Non-specific ST segment / T-wave changes, but no evidence of acute ischemia. Narrative Interpretation: no evidence of acute ischemia   ____________________________________________  RADIOLOGY I, Loleta Rose, personally viewed and evaluated these images (plain radiographs)  as part of my medical decision making, as well as reviewing the written report by the radiologist.  ED MD interpretation: No acute process present in the chest  Official radiology report(s): Dg Chest 2 View  Result Date: 11/07/2018 CLINICAL DATA:  Pt arrives via [facility] for chest pain which started yesterday. Pt denies other cardiac symptoms at this time. Pt is in NADcp EXAM: CHEST - 2 VIEW COMPARISON:  09/21/2018 FINDINGS: Normal mediastinum and cardiac silhouette. Normal pulmonary vasculature. No evidence of effusion, infiltrate, or pneumothorax. No acute bony abnormality. IMPRESSION: No acute cardiopulmonary process. Electronically Signed   By: Genevive BiStewart  Edmunds M.D.   On: 11/07/2018 02:53    ____________________________________________   PROCEDURES  Critical Care performed: No   Procedure(s) performed:   Procedures   ____________________________________________   INITIAL IMPRESSION / ASSESSMENT AND  PLAN / ED COURSE  As part of my medical decision making, I reviewed the following data within the electronic MEDICAL RECORD NUMBER Nursing notes reviewed and incorporated, Labs reviewed , EKG interpreted , Old chart reviewed and Notes from prior ED visits    Differential diagnosis includes, but is not limited to, musculoskeletal chest wall pain, pneumothorax, pneumonia, bronchitis, PE, ACS.  Unfortunately patient does not meet rule out criteria with the [facility] rule given some mild tachycardia and the present of exogenous hormones in the form of her birth control.  I have no better explanation for her chest pain given the lack of trauma and musculoskeletal chest wall tenderness.  She has a very mild troponin at 0.04 which I will repeat, but in the meantime I am going to check a d-dimer given that she is low risk and could be ruled out with a d-dimer.  I explained to her that if the d-dimer is positive she will need a CTA chest and she understands and agrees with the plan.  Currently she is not experiencing any chest pain and she is hemodynamically stable.  Lab work is otherwise reassuring with no significant changes to CBC or basic metabolic panel.  Clinical Course as of Nov 07 645  Sat Nov 07, 2018  [id number] D-dimer is within normal limits and second troponin was back to normal (less than 0.03.  She continues to report that she is not having any chest pain at this time and she has been sleeping comfortably during the night.  There was a significant delay getting the lab results back so she has had an extended period of observation and I am reassured that she is not having an acute or emergent medical condition.  I will discharge her for close outpatient follow-up and she understands and agrees with the plan.  I gave my usual and customary return precautions.   [CF]    Clinical Course User Index [CF] Loleta RoseForbach, Kalem Rockwell, MD    ____________________________________________  FINAL CLINICAL IMPRESSION(S) / ED  DIAGNOSES  Final diagnoses:  Chest pain, unspecified type     MEDICATIONS GIVEN DURING THIS VISIT:  Medications - No data to display   ED Discharge Orders    None       Note:  This document was prepared using Dragon voice recognition software and may include unintentional dictation errors.    Loleta RoseForbach, Harish Bram, MD 11/07/18 [phone]

## 2018-11-07 NOTE — ED Notes (Signed)
Phlebotomy attempt x 2 by this RN.

## 2018-11-07 NOTE — Discharge Instructions (Signed)

## 2018-11-07 NOTE — ED Triage Notes (Signed)
Pt arrives via [facility] for chest pain which started yesterday. Pt denies other cardiac symptoms at this time. Pt is in NAD.

## 2018-11-07 NOTE — ED Notes (Signed)
Patient transported to X-ray 

## 2018-11-07 NOTE — ED Notes (Signed)
Pt states that she just went to [facility] and is unable to give urine sample at this time.

## 2018-11-07 NOTE — ED Notes (Signed)
Date and time results received: 11/07/18 03:04 (use smartphrase ".now" to insert current time)  Test: troponin Critical Value: 0.04  Name of Provider Notified: Dr. York CeriseForbach  Orders Received? Or Actions Taken?: acknowledged

## 2018-12-15 IMAGING — CR DG CHEST 2V
2 series · 2 of 2 positions shown · non-contrast
Comparison: 09/21/2018

CLINICAL DATA: Pt arrives via [facility] for chest pain which started
yesterday. Pt denies other cardiac symptoms at this time. Pt is in
NADcp

EXAM:
CHEST - 2 VIEW

[chest pa]
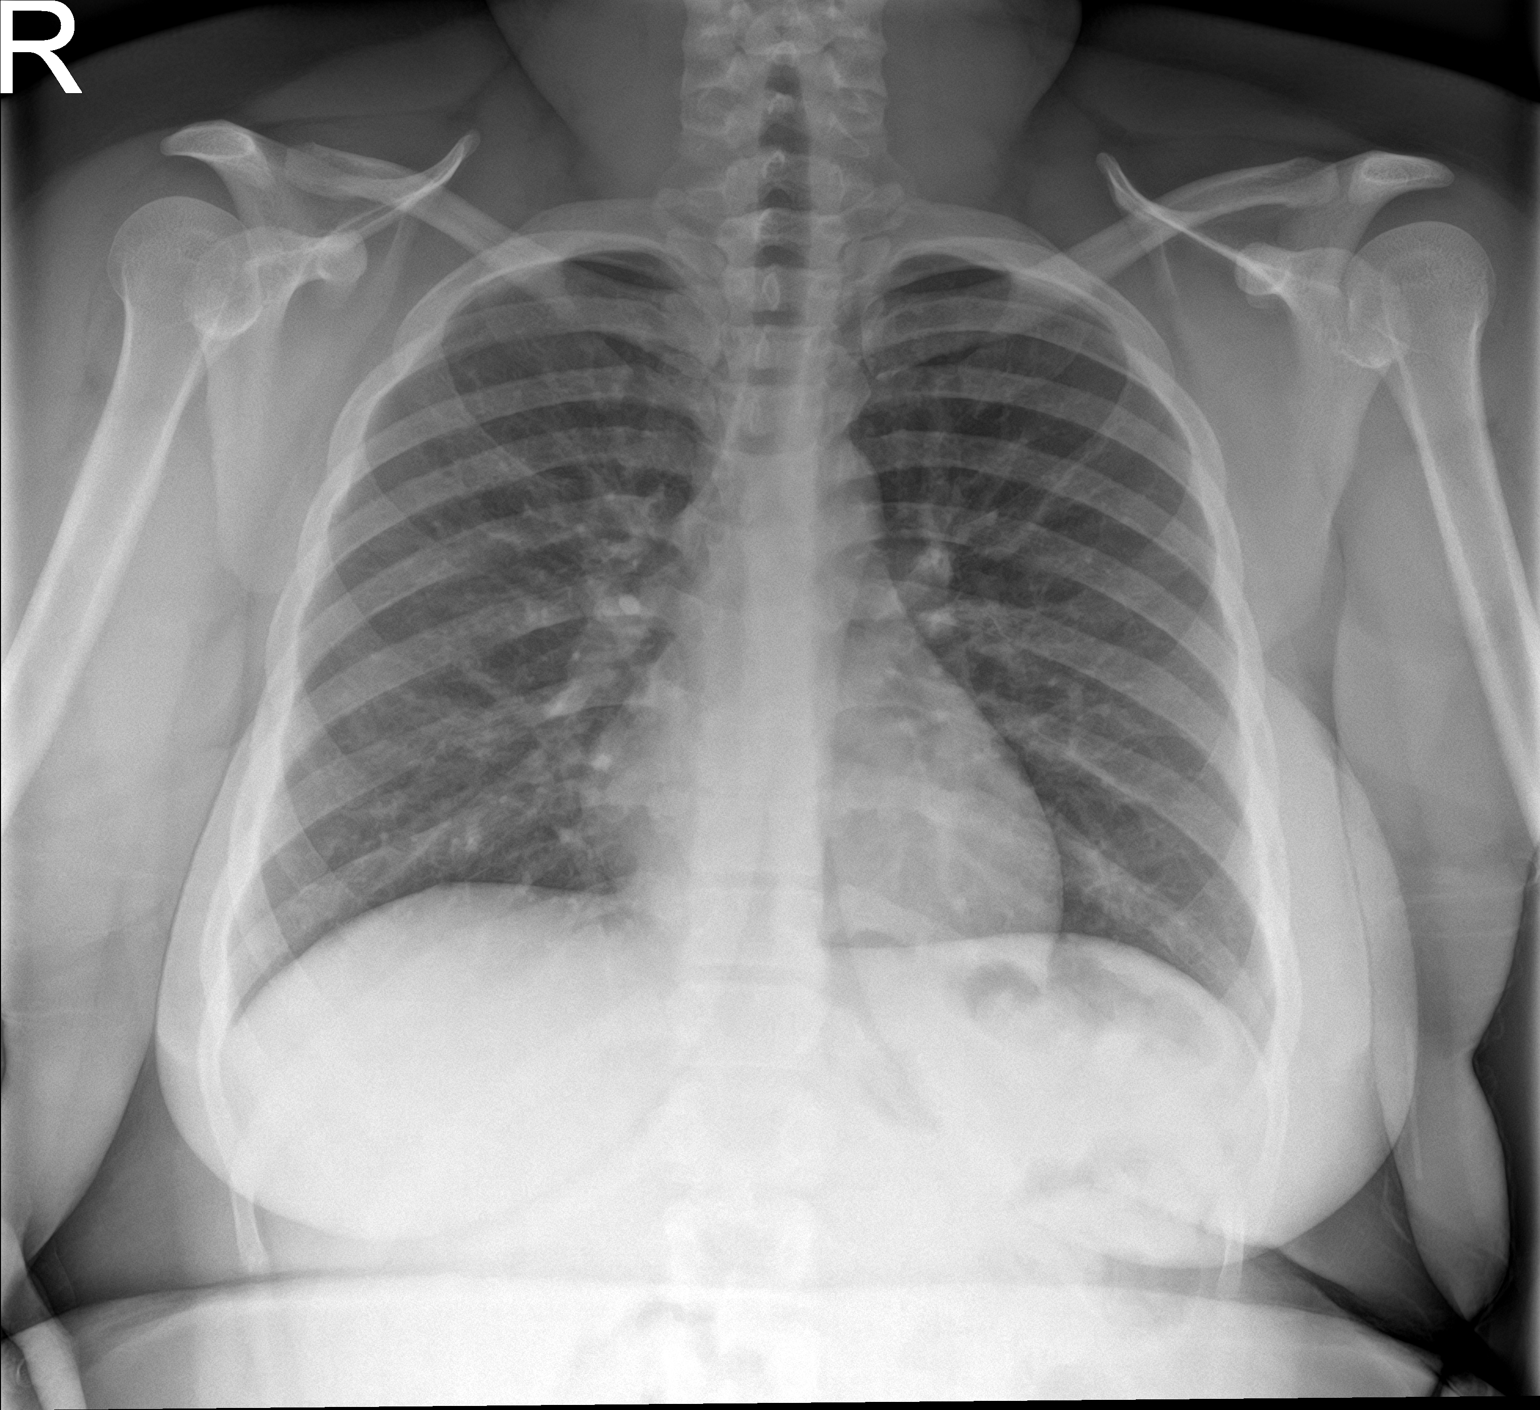

[chest lat]
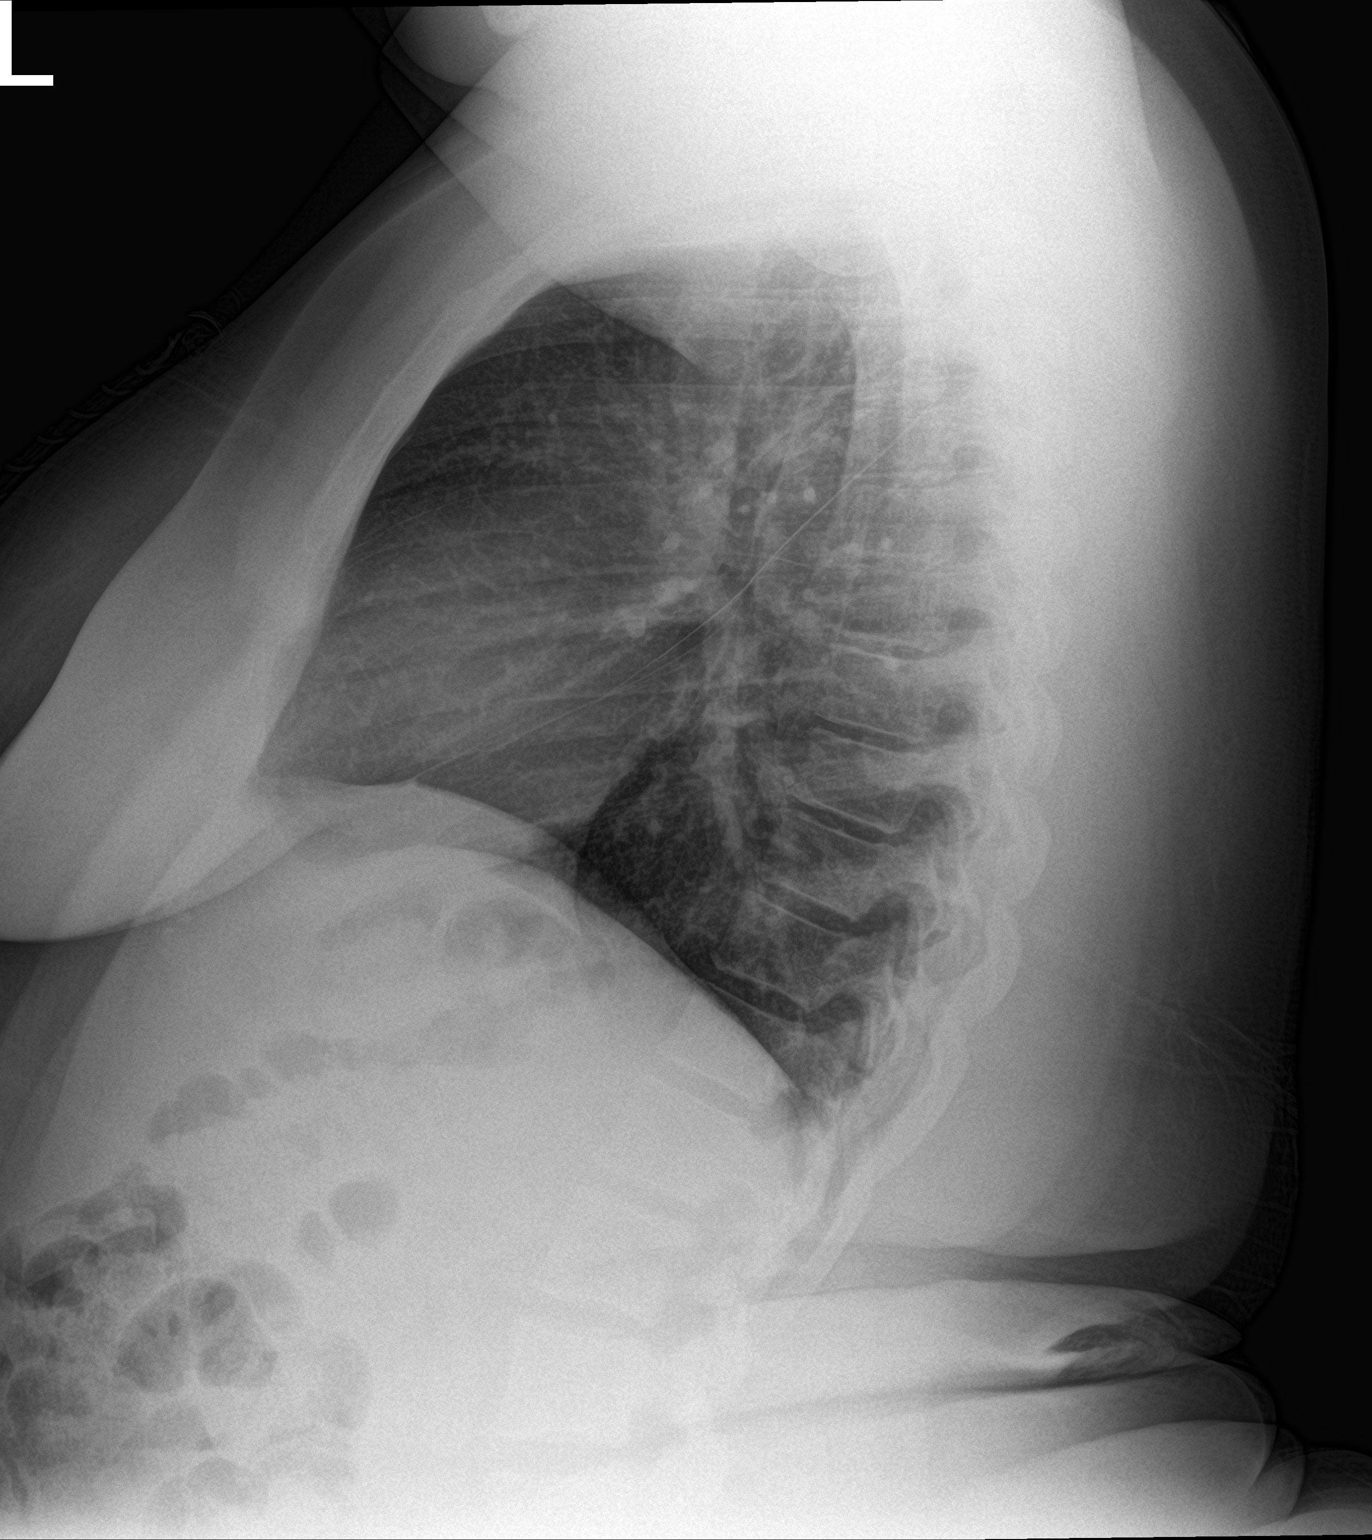

[2 of 2 positions shown; findings below may reference images not displayed]

FINDINGS: Normal mediastinum and cardiac silhouette. Normal pulmonary
vasculature. No evidence of effusion, infiltrate, or pneumothorax.
No acute bony abnormality.
IMPRESSION: No acute cardiopulmonary process.

## 2019-04-18 ENCOUNTER — Encounter: Payer: Self-pay | Admitting: Emergency Medicine

## 2019-04-18 ENCOUNTER — Other Ambulatory Visit: Payer: Self-pay

## 2019-04-18 DIAGNOSIS — M79662 Pain in left lower leg: Secondary | ICD-10-CM | POA: Diagnosis present

## 2019-04-18 DIAGNOSIS — J45909 Unspecified asthma, uncomplicated: Secondary | ICD-10-CM | POA: Insufficient documentation

## 2019-04-18 DIAGNOSIS — F17200 Nicotine dependence, unspecified, uncomplicated: Secondary | ICD-10-CM | POA: Insufficient documentation

## 2019-04-18 DIAGNOSIS — Z79899 Other long term (current) drug therapy: Secondary | ICD-10-CM | POA: Diagnosis not present

## 2019-04-18 NOTE — ED Triage Notes (Signed)
Patient brought in by ems from home. Patient with complaint of left leg and swelling times two days. Patient states that the pain became worse tonight.

## 2019-04-19 ENCOUNTER — Emergency Department: Payer: Medicaid Other

## 2019-04-19 ENCOUNTER — Emergency Department
Admission: EM | Admit: 2019-04-19 | Discharge: 2019-04-19 | Disposition: A | Discharge status: Home / Self Care | Payer: Medicaid Other | Attending: Internal Medicine | Admitting: Internal Medicine

## 2019-04-19 DIAGNOSIS — M79605 Pain in left leg: Secondary | ICD-10-CM

## 2019-04-19 DIAGNOSIS — I4891 Unspecified atrial fibrillation: Secondary | ICD-10-CM | POA: Diagnosis present

## 2019-04-19 LAB — CK: Total CK: 158 U/L (ref 38–234)

## 2019-04-19 MED ORDER — DILTIAZEM HCL 100 MG IV SOLR: 5.0000 mg/h | INTRAVENOUS | Status: DC

## 2019-04-19 MED ORDER — ONDANSETRON HCL 4 MG PO TABS: 4.0000 mg | ORAL_TABLET | Freq: Four times a day (QID) | ORAL | Status: DC | PRN

## 2019-04-19 MED ORDER — IBUPROFEN 800 MG PO TABS
800.0000 mg | ORAL_TABLET | ORAL | Status: AC
  Administered 2019-04-19: 800 mg via ORAL
  Filled 2019-04-19: qty 1

## 2019-04-19 MED ORDER — ACETAMINOPHEN 650 MG RE SUPP: 650.0000 mg | Freq: Four times a day (QID) | RECTAL | Status: DC | PRN

## 2019-04-19 MED ORDER — ENOXAPARIN SODIUM 40 MG/0.4ML ~~LOC~~ SOLN: 40.0000 mg | SUBCUTANEOUS | Status: DC

## 2019-04-19 MED ORDER — ONDANSETRON HCL 4 MG/2ML IJ SOLN: 4.0000 mg | Freq: Four times a day (QID) | INTRAMUSCULAR | Status: DC | PRN

## 2019-04-19 MED ORDER — ACETAMINOPHEN 325 MG PO TABS: 650.0000 mg | ORAL_TABLET | Freq: Four times a day (QID) | ORAL | Status: DC | PRN

## 2019-04-19 MED ORDER — DOCUSATE SODIUM 100 MG PO CAPS: 100.0000 mg | ORAL_CAPSULE | Freq: Two times a day (BID) | ORAL | Status: DC

## 2019-04-19 NOTE — ED Notes (Signed)
Report given to Jane, RN

## 2019-04-19 NOTE — ED Notes (Signed)
Patient states that the more she walked yesterday, the worse her pain in left lower leg became and more swollen it became. Patient states when she was getting up from the toilet on Sunday, her left leg "gave out" and she fell. Patient denies injuries from the fall.

## 2019-04-19 NOTE — ED Notes (Signed)
Patient transported to Ultrasound 

## 2019-04-19 NOTE — ED Notes (Signed)
Pt using call bell; requesting something to drink; understands nothing to drink until seen by MD; pt verbalized understanding

## 2019-04-19 NOTE — ED Notes (Signed)
Pt updated on wait time. Pt verbalizes understanding. Pt repositioned in wheelchair for comfort.

## 2019-04-19 NOTE — ED Provider Notes (Signed)
[facility] Emergency Department Provider Note  ____________________________________________   First MD Initiated Contact with Patient 04/19/19 [phone]     (approximate)  I have reviewed the triage vital signs and the nursing notes.   HISTORY  Chief Complaint Leg Pain   HPI Jacqueline Fletcher is a 28 y.o. female   here for evaluation of left leg discomfort and pain.  Patient reports that for about 2 days now she is felt like she is had pain and discomfort for about the left knee down to just above the ankle.  She feels like her knee and left leg are slightly swollen.  She reports that achy to move.  No fevers or chills.  Reports she has had similar in the past involving the knees but does not have a history of arthritis.  She is not take any medication to alleviate it.  Reports just uncomfortable.  No falls injuries or trauma.  No nausea vomiting.  Denies pregnancy.  Reports the pain is just a full feeling behind her knee and down into the back of the calf.  She still able to move the leg but reports it hurts when she goes through range of motion.  No hip pain.  Past Medical History:  Diagnosis Date   Asthma    Bipolar I disorder, current or most recent episode depressed, with psychotic features (HCC)    Schizophrenia (HCC)    Sleep apnea     Patient Active Problem List   Diagnosis Date Noted   Atrial fibrillation with RVR (HCC) 04/19/2019   Bipolar I disorder, current or most recent episode depressed, with psychotic features (HCC) 07/07/2017    Past Surgical History:  Procedure Laterality Date   c-section     TONSILLECTOMY      Prior to Admission medications   Medication Sig Start Date End Date Taking? Authorizing Provider  albuterol (PROVENTIL HFA;VENTOLIN HFA) 108 (90 Base) MCG/ACT inhaler Inhale 2 puffs into the lungs every 4 (four) hours as needed for wheezing or shortness of breath. 01/03/18   Cuthriell, Delorise Royals, PA-C  amoxicillin  (AMOXIL) 500 MG capsule Take 1 capsule (500 mg total) by mouth 3 (three) times daily. Patient not taking: Reported on 11/07/2018 08/22/18   Joni Reining, PA-C  fluvoxaMINE (LUVOX) 100 MG tablet Take 1 tablet (100 mg total) by mouth at bedtime. Patient not taking: Reported on 11/07/2018 07/10/17   Pucilowska, Braulio Conte B, MD  furosemide (LASIX) 20 MG tablet Take 1 tablet (20 mg total) by mouth daily. Patient not taking: Reported on 11/07/2018 06/24/18 06/24/19  Joni Reining, PA-C  lidocaine (XYLOCAINE) 2 % solution Use as directed 5 mLs in the mouth or throat every 6 (six) hours as needed for mouth pain. For oral swish. Patient not taking: Reported on 11/07/2018 08/22/18   Joni Reining, PA-C  naproxen (NAPROSYN) 500 MG tablet Take 1 tablet (500 mg total) by mouth 2 (two) times daily with a meal. Patient not taking: Reported on 11/07/2018 09/21/18   Tommi Rumps, PA-C  risperiDONE (RISPERDAL) 2 MG tablet Take 1 tablet (2 mg total) by mouth 2 (two) times daily. Patient not taking: Reported on 11/07/2018 07/10/17   Pucilowska, Ellin Goodie, MD  traMADol (ULTRAM) 50 MG tablet Take 1 tablet (50 mg total) by mouth every 12 (twelve) hours as needed. Patient not taking: Reported on 11/07/2018 08/22/18   Joni Reining, PA-C  traZODone (DESYREL) 100 MG tablet Take 1 tablet (100 mg total) by mouth  at bedtime. Patient not taking: Reported on 11/07/2018 07/10/17   Shari Prows, MD    Allergies Shellfish allergy  No family history on file.  Social History Social History   Tobacco Use   Smoking status: Current Every Day Smoker   Smokeless tobacco: Never Used  Substance Use Topics   Alcohol use: Not Currently   Drug use: No    Review of Systems Constitutional: No fever/chills Eyes: No visual changes. Cardiovascular: Denies chest pain. Respiratory: Denies shortness of breath. Gastrointestinal: No abdominal pain.   Genitourinary: Negative for dysuria.  Denies any chance of  pregnancy. Musculoskeletal: Negative for back pain.  There is no pain or shooting from her lower back into the leg. Skin: Negative for rash. Neurological: Negative for headaches, areas of focal weakness or numbness.    ____________________________________________   PHYSICAL EXAM:  VITAL SIGNS: ED Triage Vitals [04/18/19 2342]  Enc Vitals Group     BP 111/69     Pulse Rate (!) 116     Resp 18     Temp 98.3 F (36.8 C)     Temp Source Oral     SpO2 100 %     Weight 235 lb (106.6 kg)     Height  (1.575 m)     Head Circumference      Peak Flow      Pain Score 8     Pain Loc      Pain Edu?      Excl. in GC?     Constitutional: Alert and oriented. Well appearing and in no acute distress.  She is very pleasant. Eyes: Conjunctivae are normal. Head: Atraumatic. Mouth/Throat: Mucous membranes are moist. Neck: No stridor.  Cardiovascular: Normal rate, regular rhythm. Grossly normal heart sounds.  Good peripheral circulation. Respiratory: Normal respiratory effort.  No retractions. Lungs CTAB. Gastrointestinal: Soft and nontender.  Musculoskeletal:   Lower Extremities  No edema. Normal DP/PT pulses bilateral with good cap refill.  Normal neuro-motor function lower extremities bilateral.  RIGHT Right lower extremity demonstrates normal strength, good use of all muscles. No edema bruising or contusions of the right hip, right knee, right ankle. Full range of motion of the right lower extremity without pain. No pain on axial loading. No evidence of trauma.  LEFT Left lower extremity demonstrates normal strength, good use of all muscles but a little limitation as she reports some pain and discomfort in the posterior calf with motion but full range of motion is present. No edema bruising or contusions of the hip,  knee, ankle.  There is no appreciable edema in the left leg compared to the right, both are rather large and gross likely secondary patient body habitus.  There is  no pitting edema.  Full range of motion of the left lower extremity without pain. No pain on axial loading. No evidence of trauma.   Neurologic:  Normal speech and language. No gross focal neurologic deficits are appreciated.  Skin:  Skin is warm, dry and intact. No rash noted. Psychiatric: Mood and affect are normal. Speech and behavior are normal.  ____________________________________________   LABS (all labs ordered are listed, but only abnormal results are displayed)  Labs Reviewed  CK   ____________________________________________  EKG   ____________________________________________  RADIOLOGY  US Venous Img Lower Unilateral Left  Result Date: 04/19/2019 CLINICAL DATA:  28 y/o  F; 2 days of left lower extremity edema. EXAM: LEFT LOWER EXTREMITY VENOUS DOPPLER ULTRASOUND TECHNIQUE: Gray-scale sonography with graded compression, as  well as color Doppler and duplex ultrasound were performed to evaluate the lower extremity deep venous systems from the level of the common femoral vein and including the common femoral, femoral, profunda femoral, popliteal and calf veins including the posterior tibial, peroneal and gastrocnemius veins when visible. The superficial great saphenous vein was also interrogated. Spectral Doppler was utilized to evaluate flow at rest and with distal augmentation maneuvers in the common femoral, femoral and popliteal veins. COMPARISON:  None. FINDINGS: Contralateral Common Femoral Vein: Respiratory phasicity is normal and symmetric with the symptomatic side. No evidence of thrombus. Normal compressibility. Common Femoral Vein: No evidence of thrombus. Normal compression. Augmentation not assessed. Saphenofemoral Junction: No evidence of thrombus. Normal compression. Augmentation not assessed. Profunda Femoral Vein: No evidence of thrombus. Normal compression. Augmentation not assessed. Femoral Vein: Normal blood flow detected on color Doppler. Venous signal absent on  compression. Augmentation not assessed. Popliteal Vein: Normal blood flow detected on color Doppler. Compression and augmentation not assessed. Calf Veins: Normal blood flow detected on color Doppler. Compression and augmentation not assessed. Superficial Great Saphenous Vein: Not assessed. Venous Reflux:  None. Other Findings: Study limited by body habitus and inability of patient to tolerate compressions. IMPRESSION: 1. Study limited by body habitus and inability to tolerate compressions. 2. No occlusive deep venous thrombus of the left lower extremity. Nonocclusive thrombus cannot be excluded. Electronically Signed   By: Mitzi Hansen M.D.   On: 04/19/2019 01:54   Dg Knee Complete 4 Views Left  Result Date: 04/19/2019 CLINICAL DATA:  Atraumatic left knee pain EXAM: LEFT KNEE - COMPLETE 4+ VIEW COMPARISON:  None. FINDINGS: No evidence of fracture, dislocation, or joint effusion. No evidence of arthropathy or other focal bone abnormality. Soft tissues are unremarkable. IMPRESSION: Negative. Electronically Signed   By: Marnee Spring M.D.   On: 04/19/2019 06:53    X-ray negative for acute.  Ultrasound negative for DVT, some limitation.  However, in the context of her presentation without evidence of DVT by examination finding either I would highly doubt DVT as a cause of her discomfort or pain. ____________________________________________   PROCEDURES  Procedure(s) performed: None  Procedures  Critical Care performed: No  ____________________________________________   INITIAL IMPRESSION / ASSESSMENT AND PLAN / ED COURSE  Pertinent labs & imaging results that were available during my care of the patient were reviewed by me and considered in my medical decision making (see chart for details).   Atraumatic pain primarily over the left calf region on and behind and within the left knee region.  There is no evidence of erythema, warmth or fever.  No signs or symptoms of infection.   She reports similar discomfort in her knees previous, DVT studies negative CK is negative, and x-rays are normal.  Distal neurovascular examination is normal as well.  Discussed with patient, likely musculoskeletal etiology, patient comfortable with this plan and careful return precautions which are reviewed with her.  Return precautions and treatment recommendations and follow-up discussed with the patient who is agreeable with the plan.  Patient reports pain is quite a bit better after taking ibuprofen as well.    Return precautions and treatment recommendations and follow-up discussed with the patient who is agreeable with the plan.    ____________________________________________   FINAL CLINICAL IMPRESSION(S) / ED DIAGNOSES  Final diagnoses:  Left leg pain        Note:  This document was prepared using Dragon voice recognition software and may include unintentional dictation errors  Sharyn CreamerQuale, Yamin Swingler, MD 04/19/19 [phone]

## 2019-04-19 NOTE — ED Notes (Signed)
Patient assisted to the bathroom and given a warm blanket. Patient given update on wait time. Patient verbalizes understanding.

## 2019-04-29 IMAGING — US US EXTREM LOW VENOUS*R*
1 series · 13 of 24 positions shown · non-contrast
Comparison: None.

CLINICAL DATA: 27-year-old female with a history of right leg edema



[Series 1: us extrem low venous*right* · 0.08mm/px · 13 of 34 slices shown]
[im 1/34]
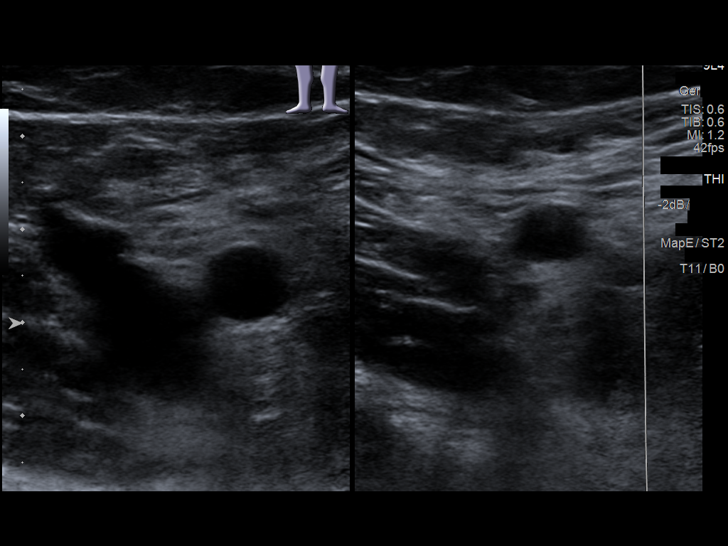
[im 3/34]
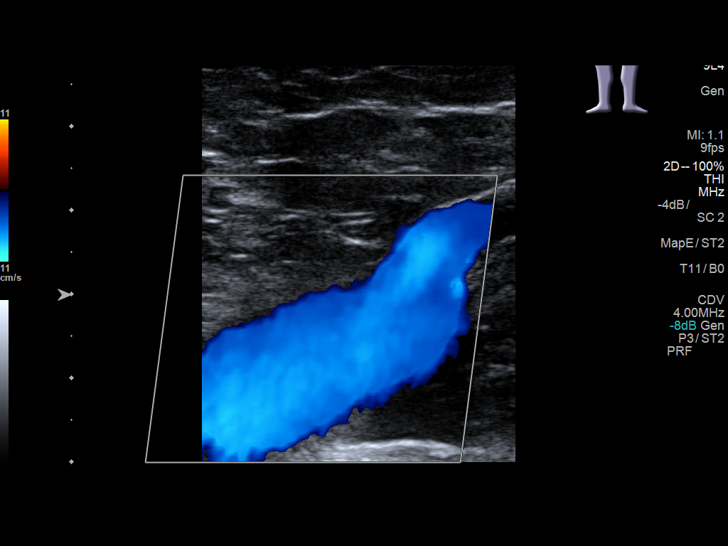
[im 6/34]
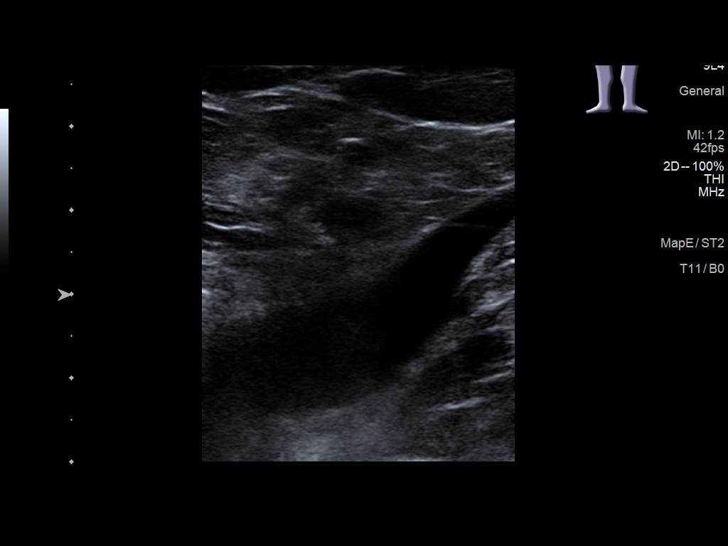
[im 9/34]
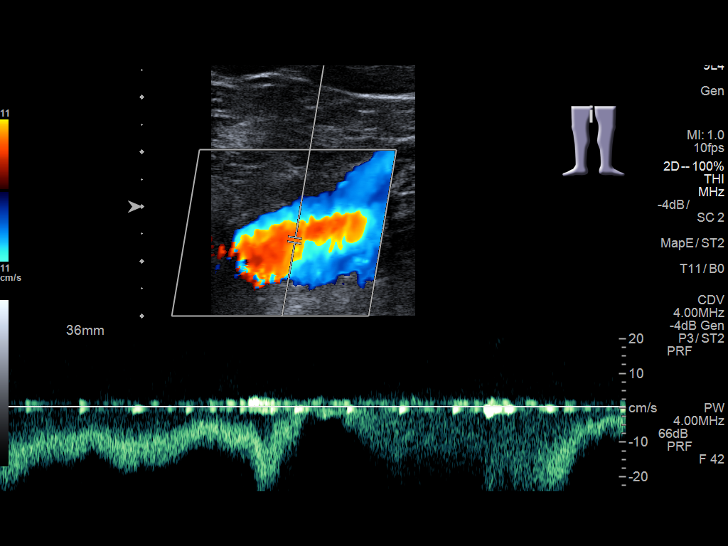
[im 12/34]
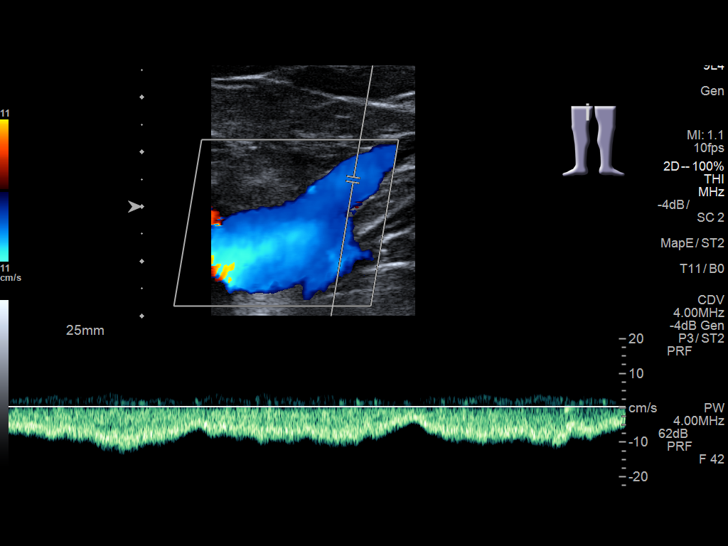
[im 15/34]
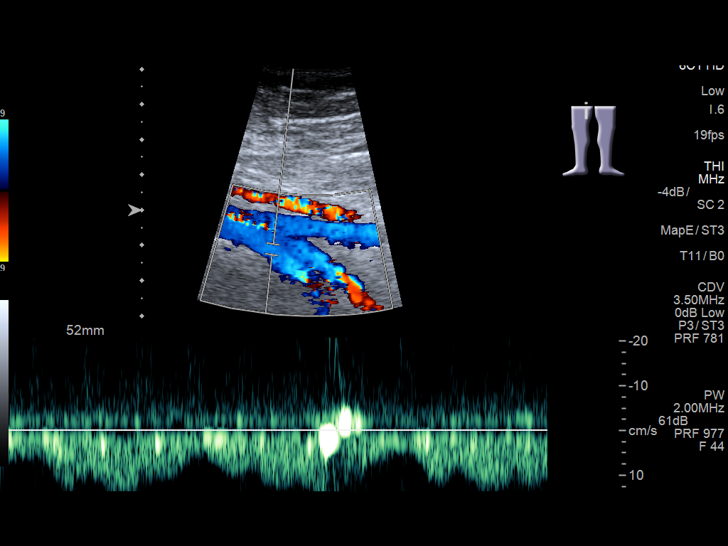
[im 18/34]
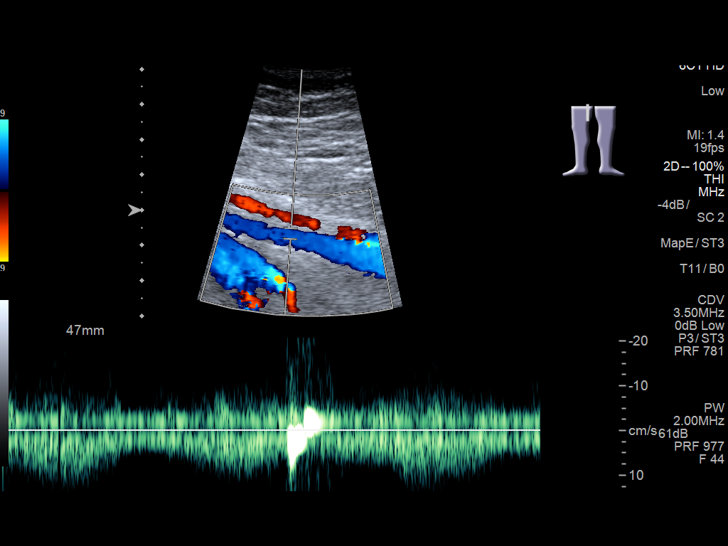
[im 19/34]
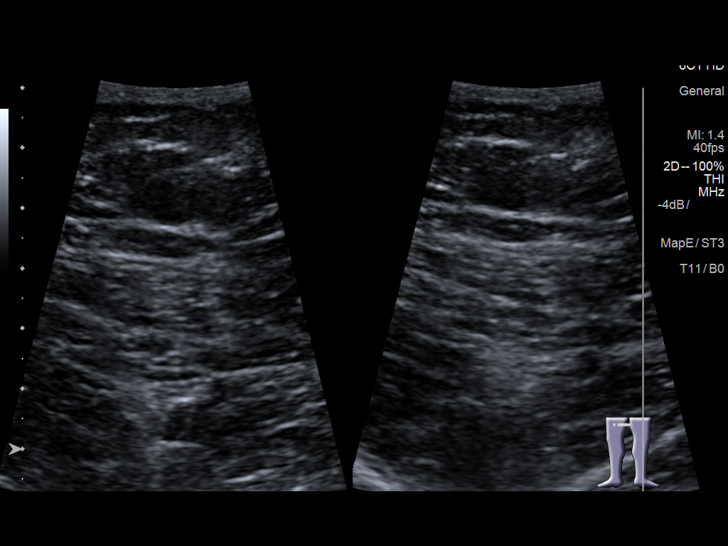
[im 22/34]
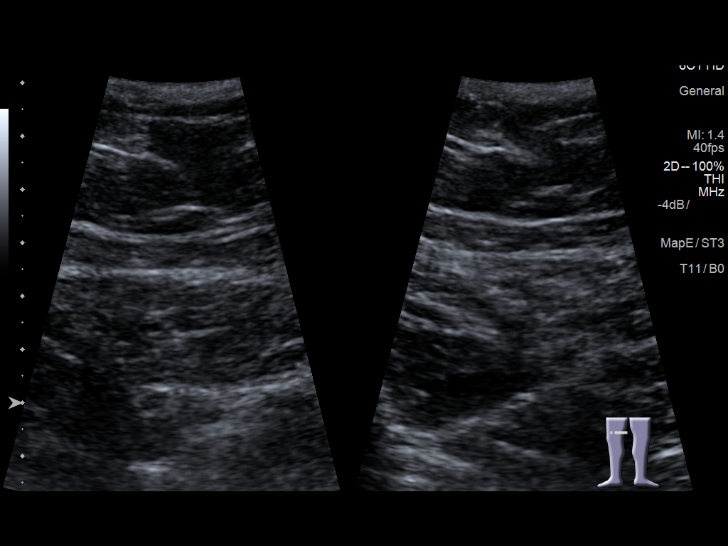
[im 25/34]
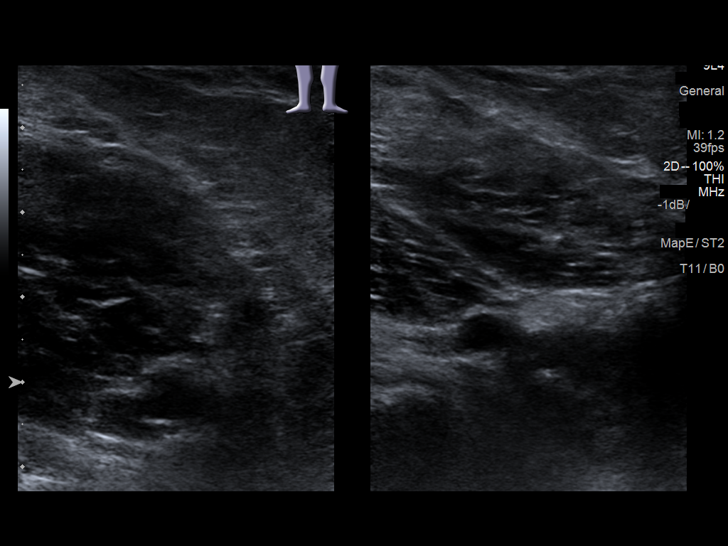
[im 28/34]
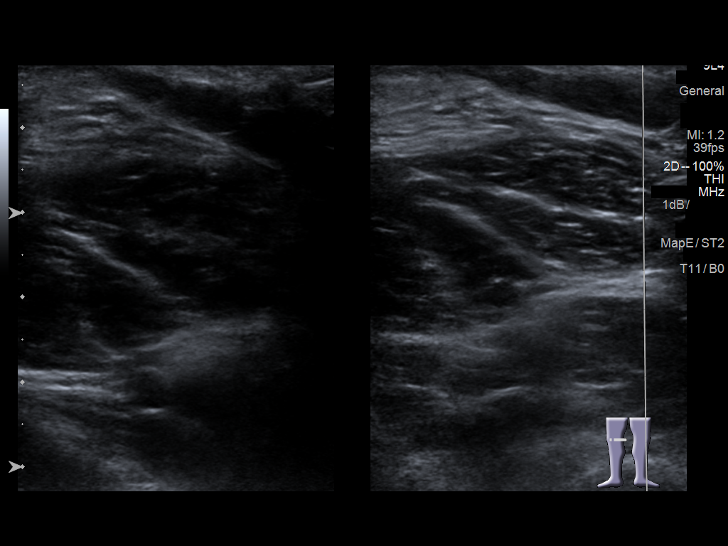
[im 31/34]
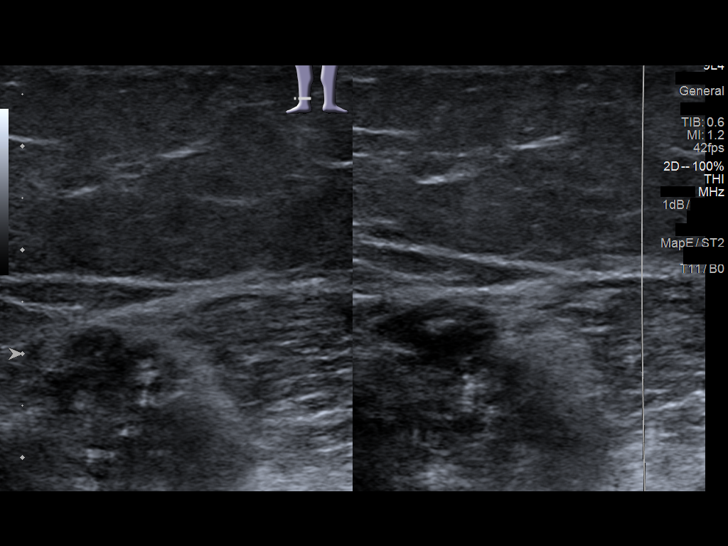
[im 34/34]
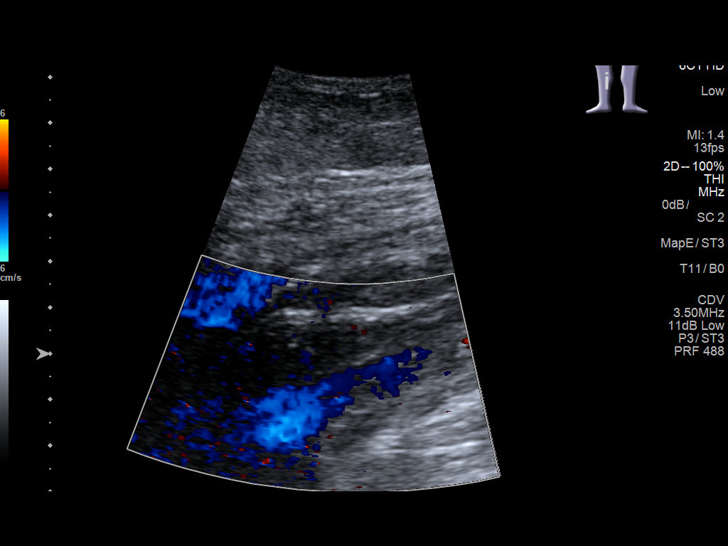

[13 of 24 positions shown; findings below may reference images not displayed]

FINDINGS: Contralateral Common Femoral Vein: Respiratory phasicity is normal
and symmetric with the symptomatic side. No evidence of thrombus.
Normal compressibility.

Common Femoral Vein: No evidence of thrombus. Normal
compressibility, respiratory phasicity and response to augmentation.

Saphenofemoral Junction: No evidence of thrombus. Normal
compressibility and flow on color Doppler imaging.

Profunda Femoral Vein: No evidence of thrombus. Normal
compressibility and flow on color Doppler imaging.

Femoral Vein: No evidence of thrombus. Normal compressibility,
respiratory phasicity and response to augmentation.

Popliteal Vein: No evidence of thrombus. Normal compressibility,
respiratory phasicity and response to augmentation.

Calf Veins: No evidence of thrombus. Normal compressibility and flow
on color Doppler imaging.

Superficial Great Saphenous Vein: No evidence of thrombus. Normal
compressibility and flow on color Doppler imaging.

Other Findings:  None.
IMPRESSION: Sonographic survey of the right lower extremity negative for DVT

## 2020-01-12 NOTE — Progress Notes (Addendum)
PCP:  Patient, No Pcp Per   Chief Complaint  Patient presents with  . Gynecologic Exam     HPI:      Ms. Jacqueline Fletcher is a 29 y.o. No obstetric history on file. who LMP was No LMP recorded. Patient has had an implant., presents today for her NP > 3 yrs annual examination.  Her menses are absent with nexplanon. No BTB, dysmen.   Sex activity: single partner, contraception - Nexplanon, placed 08/20/17.  Last Pap: not recent; no hx of abn Hx of STDs: none; wants full STD testing  There is no FH of breast cancer. There is no FH of ovarian cancer. The patient does not do self-breast exams. Mom with cancer last yr but pt unsure of primary site.  Tobacco use: quit last yr Alcohol use: none No drug use.  Exercise: not active  She does not get adequate calcium and Vitamin D in her diet.   Past Medical History:  Diagnosis Date  . Asthma   . Bipolar I disorder, current or most recent episode depressed, with psychotic features (HCC)   . Schizophrenia (HCC)   . Sleep apnea     Past Surgical History:  Procedure Laterality Date  . c-section    . TONSILLECTOMY      History reviewed. No pertinent family history.  Social History   Socioeconomic History  . Marital status: Single    Spouse name: Not on file  . Number of children: Not on file  . Years of education: Not on file  . Highest education level: Not on file  Occupational History  . Not on file  Tobacco Use  . Smoking status: Current Every Day Smoker  . Smokeless tobacco: Never Used  Substance and Sexual Activity  . Alcohol use: Not Currently  . Drug use: No  . Sexual activity: Yes    Birth control/protection: Implant  Other Topics Concern  . Not on file  Social History Narrative  . Not on file   Social Determinants of Health   Financial Resource Strain:   . Difficulty of Paying Living Expenses: Not on file  Food Insecurity:   . Worried About Programme researcher, broadcasting/film/video in the Last Year: Not on file  . Ran Out  of Food in the Last Year: Not on file  Transportation Needs:   . Lack of Transportation (Medical): Not on file  . Lack of Transportation (Non-Medical): Not on file  Physical Activity:   . Days of Exercise per Week: Not on file  . Minutes of Exercise per Session: Not on file  Stress:   . Feeling of Stress : Not on file  Social Connections:   . Frequency of Communication with Friends and Family: Not on file  . Frequency of Social Gatherings with Friends and Family: Not on file  . Attends Religious Services: Not on file  . Active Member of Clubs or Organizations: Not on file  . Attends Banker Meetings: Not on file  . Marital Status: Not on file  Intimate Partner Violence:   . Fear of Current or Ex-Partner: Not on file  . Emotionally Abused: Not on file  . Physically Abused: Not on file  . Sexually Abused: Not on file     Current Outpatient Medications:  .  etonogestrel (NEXPLANON) 68 MG IMPL implant, 1 each by Subdermal route once., Disp: , Rfl:  .  albuterol (PROVENTIL HFA;VENTOLIN HFA) 108 (90 Base) MCG/ACT inhaler, Inhale 2 puffs into the  lungs every 4 (four) hours as needed for wheezing or shortness of breath. (Patient not taking: Reported on 01/13/2020), Disp: 1 Inhaler, Rfl: 0 .  fluvoxaMINE (LUVOX) 100 MG tablet, Take 1 tablet (100 mg total) by mouth at bedtime. (Patient not taking: Reported on 11/07/2018), Disp: 30 tablet, Rfl: 1 .  furosemide (LASIX) 20 MG tablet, Take 1 tablet (20 mg total) by mouth daily. (Patient not taking: Reported on 11/07/2018), Disp: 30 tablet, Rfl: 0 .  lidocaine (XYLOCAINE) 2 % solution, Use as directed 5 mLs in the mouth or throat every 6 (six) hours as needed for mouth pain. For oral swish. (Patient not taking: Reported on 11/07/2018), Disp: 100 mL, Rfl: 0 .  naproxen (NAPROSYN) 500 MG tablet, Take 1 tablet (500 mg total) by mouth 2 (two) times daily with a meal. (Patient not taking: Reported on 11/07/2018), Disp: 30 tablet, Rfl: 0 .   risperiDONE (RISPERDAL) 2 MG tablet, Take 1 tablet (2 mg total) by mouth 2 (two) times daily. (Patient not taking: Reported on 11/07/2018), Disp: 60 tablet, Rfl: 1 .  traMADol (ULTRAM) 50 MG tablet, Take 1 tablet (50 mg total) by mouth every 12 (twelve) hours as needed. (Patient not taking: Reported on 11/07/2018), Disp: 12 tablet, Rfl: 0 .  traZODone (DESYREL) 100 MG tablet, Take 1 tablet (100 mg total) by mouth at bedtime. (Patient not taking: Reported on 11/07/2018), Disp: 30 tablet, Rfl: 1     ROS:  Review of Systems  Constitutional: Negative for fatigue, fever and unexpected weight change.  Respiratory: Negative for cough, shortness of breath and wheezing.   Cardiovascular: Negative for chest pain, palpitations and leg swelling.  Gastrointestinal: Negative for blood in stool, constipation, diarrhea, nausea and vomiting.  Endocrine: Negative for cold intolerance, heat intolerance and polyuria.  Genitourinary: Negative for dyspareunia, dysuria, flank pain, frequency, genital sores, hematuria, menstrual problem, pelvic pain, urgency, vaginal bleeding, vaginal discharge and vaginal pain.  Musculoskeletal: Negative for back pain, joint swelling and myalgias.  Skin: Negative for rash.  Neurological: Negative for dizziness, syncope, light-headedness, numbness and headaches.  Hematological: Negative for adenopathy.  Psychiatric/Behavioral: Negative for agitation, confusion, sleep disturbance and suicidal ideas. The patient is not nervous/anxious.   BREAST: No symptoms   Objective: BP 120/80   Ht 5\' 2"  (1.575 m)   Wt (!) 309 lb (140.2 kg)   BMI 56.52 kg/m    Physical Exam Constitutional:      Appearance: She is well-developed.  Genitourinary:     Vulva, vagina, cervix, uterus, right adnexa and left adnexa normal.     No vulval lesion or tenderness noted.     No vaginal discharge, erythema or tenderness.     No cervical polyp.     Uterus is not enlarged or tender.     No right or  left adnexal mass present.     Right adnexa not tender.     Left adnexa not tender.  Neck:     Thyroid: No thyromegaly.  Cardiovascular:     Rate and Rhythm: Normal rate and regular rhythm.     Heart sounds: Normal heart sounds. No murmur.  Pulmonary:     Effort: Pulmonary effort is normal.     Breath sounds: Normal breath sounds.  Chest:     Breasts:        Right: No mass, nipple discharge, skin change or tenderness.        Left: No mass, nipple discharge, skin change or tenderness.  Abdominal:  Palpations: Abdomen is soft.     Tenderness: There is no abdominal tenderness. There is no guarding.  Musculoskeletal:        General: Normal range of motion.     Cervical back: Normal range of motion.  Neurological:     General: No focal deficit present.     Mental Status: She is alert and oriented to person, place, and time.     Cranial Nerves: No cranial nerve deficit.  Skin:    General: Skin is warm and dry.  Psychiatric:        Mood and Affect: Mood normal.        Behavior: Behavior normal.        Thought Content: Thought content normal.        Judgment: Judgment normal.  Vitals reviewed.     Assessment/Plan: Encounter for annual routine gynecological examination  Cervical cancer screening - Plan: Cytology - PAP  Screening for STD (sexually transmitted disease) - Plan: Cytology - PAP, HIV Antibody (routine testing w rflx), RPR, Hepatitis C antibody  Encounter for surveillance of implantable subdermal contraceptive--due for replacement 08/20/17.    GYN counsel adequate intake of calcium and vitamin D, diet and exercise     F/U  Return in about 1 year (around 01/12/2021).  Deema Juncaj B. Thressa Shiffer, PA-C 01/13/2020 8:42 AM

## 2020-01-13 ENCOUNTER — Other Ambulatory Visit (HOSPITAL_COMMUNITY)
Admission: RE | Admit: 2020-01-13 | Discharge: 2020-01-13 | Disposition: A | Discharge status: Home / Self Care | Payer: Medicaid Other | Source: Ambulatory Visit | Attending: Obstetrics and Gynecology | Admitting: Obstetrics and Gynecology

## 2020-01-13 ENCOUNTER — Ambulatory Visit (INDEPENDENT_AMBULATORY_CARE_PROVIDER_SITE_OTHER): Payer: Medicaid Other | Admitting: Obstetrics and Gynecology

## 2020-01-13 ENCOUNTER — Encounter: Payer: Self-pay | Admitting: Obstetrics and Gynecology

## 2020-01-13 ENCOUNTER — Other Ambulatory Visit: Payer: Self-pay

## 2020-01-13 VITALS — BP 120/80 | Ht 62.0 in | Wt 309.0 lb

## 2020-01-13 DIAGNOSIS — Z01419 Encounter for gynecological examination (general) (routine) without abnormal findings: Secondary | ICD-10-CM

## 2020-01-13 DIAGNOSIS — Z113 Encounter for screening for infections with a predominantly sexual mode of transmission: Secondary | ICD-10-CM | POA: Diagnosis present

## 2020-01-13 DIAGNOSIS — Z3046 Encounter for surveillance of implantable subdermal contraceptive: Secondary | ICD-10-CM

## 2020-01-13 DIAGNOSIS — Z0000 Encounter for general adult medical examination without abnormal findings: Secondary | ICD-10-CM

## 2020-01-13 DIAGNOSIS — Z124 Encounter for screening for malignant neoplasm of cervix: Secondary | ICD-10-CM | POA: Insufficient documentation

## 2020-01-13 NOTE — Patient Instructions (Signed)
I value your feedback and entrusting us with your care. If you get a [facility] patient survey, I would appreciate you taking the time to let us know about your experience today. Thank you!  As of November 11, 2019, your lab results will be released to your MyChart immediately, before I even have a chance to see them. Please give me time to review them and contact you if there are any abnormalities. Thank you for your patience.  

## 2020-01-14 LAB — HEPATITIS C ANTIBODY: Hep C Virus Ab: <0.1 s/co ratio (ref 0.0–0.9)

## 2020-01-14 LAB — HIV ANTIBODY (ROUTINE TESTING W REFLEX): HIV Screen 4th Generation wRfx: NONREACTIVE

## 2020-01-14 LAB — RPR: RPR Ser Ql: NONREACTIVE

## 2020-01-17 LAB — CYTOLOGY - PAP
Chlamydia: NEGATIVE
Comment: NEGATIVE
Comment: NORMAL
Diagnosis: NEGATIVE
Neisseria Gonorrhea: NEGATIVE

## 2020-03-17 ENCOUNTER — Emergency Department: Payer: Medicaid Other

## 2020-03-17 ENCOUNTER — Other Ambulatory Visit: Payer: Self-pay

## 2020-03-17 ENCOUNTER — Emergency Department
Admission: EM | Admit: 2020-03-17 | Discharge: 2020-03-17 | Disposition: A | Discharge status: Home / Self Care | Payer: Medicaid Other | Attending: Emergency Medicine | Admitting: Emergency Medicine

## 2020-03-17 ENCOUNTER — Encounter: Payer: Self-pay | Admitting: Emergency Medicine

## 2020-03-17 DIAGNOSIS — Z793 Long term (current) use of hormonal contraceptives: Secondary | ICD-10-CM | POA: Diagnosis not present

## 2020-03-17 DIAGNOSIS — R0789 Other chest pain: Secondary | ICD-10-CM | POA: Insufficient documentation

## 2020-03-17 DIAGNOSIS — Z79899 Other long term (current) drug therapy: Secondary | ICD-10-CM | POA: Insufficient documentation

## 2020-03-17 DIAGNOSIS — J45909 Unspecified asthma, uncomplicated: Secondary | ICD-10-CM | POA: Insufficient documentation

## 2020-03-17 DIAGNOSIS — F17210 Nicotine dependence, cigarettes, uncomplicated: Secondary | ICD-10-CM | POA: Insufficient documentation

## 2020-03-17 LAB — CBC
HCT: 38 % (ref 36.0–46.0)
Hemoglobin: 12 g/dL (ref 12.0–15.0)
MCH: 26.5 pg (ref 26.0–34.0)
MCHC: 31.6 g/dL (ref 30.0–36.0)
MCV: 83.9 fL (ref 80.0–100.0)
Platelets: 347 10*3/uL (ref 150–400)
RBC: 4.53 MIL/uL (ref 3.87–5.11)
RDW: 14.5 % (ref 11.5–15.5)
WBC: 7.6 10*3/uL (ref 4.0–10.5)
nRBC: 0 % (ref 0.0–0.2)

## 2020-03-17 LAB — BASIC METABOLIC PANEL
Anion gap: 8 (ref 5–15)
BUN: 11 mg/dL (ref 6–20)
CO2: 23 mmol/L (ref 22–32)
Calcium: 8.6 mg/dL — ABNORMAL LOW (ref 8.9–10.3)
Chloride: 104 mmol/L (ref 98–111)
Creatinine, Ser: 0.8 mg/dL (ref 0.44–1.00)
GFR calc Af Amer: >60 mL/min (ref >60)
GFR calc non Af Amer: >60 mL/min (ref >60)
Glucose, Bld: 109 mg/dL — ABNORMAL HIGH (ref 70–99)
Potassium: 4.1 mmol/L (ref 3.5–5.1)
Sodium: 135 mmol/L (ref 135–145)

## 2020-03-17 LAB — TROPONIN I (HIGH SENSITIVITY)
Troponin I (High Sensitivity): 3 ng/L (ref <18)
Troponin I (High Sensitivity): 3 ng/L (ref <18)

## 2020-03-17 LAB — POCT PREGNANCY, URINE: Preg Test, Ur: NEGATIVE

## 2020-03-17 MED ORDER — NAPROXEN 500 MG PO TABS: 500.0000 mg | ORAL_TABLET | Freq: Two times a day (BID) | ORAL | 2 refills | Status: AC

## 2020-03-17 MED ORDER — LIDOCAINE VISCOUS HCL 2 % MT SOLN
15.0000 mL | Freq: Once | OROMUCOSAL | Status: AC
  Administered 2020-03-17: 15 mL via ORAL
  Filled 2020-03-17: qty 15

## 2020-03-17 MED ORDER — ALUM & MAG HYDROXIDE-SIMETH 200-200-20 MG/5ML PO SUSP
30.0000 mL | Freq: Once | ORAL | Status: AC
  Administered 2020-03-17: 30 mL via ORAL
  Filled 2020-03-17: qty 30

## 2020-03-17 MED ORDER — KETOROLAC TROMETHAMINE 30 MG/ML IJ SOLN
30.0000 mg | Freq: Once | INTRAMUSCULAR | Status: AC
  Administered 2020-03-17: 30 mg via INTRAVENOUS
  Filled 2020-03-17: qty 1

## 2020-03-17 MED ORDER — KETOROLAC TROMETHAMINE 30 MG/ML IJ SOLN: 30.0000 mg | Freq: Once | INTRAMUSCULAR | Status: DC

## 2020-03-17 NOTE — ED Provider Notes (Signed)
[facility] Emergency Department Provider Note   ____________________________________________    I have reviewed the triage vital signs and the nursing notes.   HISTORY  Chief Complaint Chest Pain     HPI Jacqueline Fletcher is a 29 y.o. female who presents with complaints of chest pain.  Patient reports when she woke up this morning she felt pain in her left anterior chest wall.  Yesterday evening she felt well when she went to bed.  No fevers chills or cough.  No radiation.  No sick contacts.  Has never had this before.  Denies shortness of breath.  No back pain.  Pain is isolated to under the left breast.  No pleurisy.  EMS gave aspirin which she reports has helped somewhat.   Past Medical History:  Diagnosis Date  . Asthma   . Bipolar I disorder, current or most recent episode depressed, with psychotic features ([place])   . Schizophrenia ([place])   . Sleep apnea     Patient Active Problem List   Diagnosis Date Noted  . Atrial fibrillation with RVR ([place]) 04/19/2019  . Bipolar I disorder, current or most recent episode depressed, with psychotic features ([place]) 07/07/2017    Past Surgical History:  Procedure Laterality Date  . c-section    . TONSILLECTOMY      Prior to Admission medications   Medication Sig Start Date End Date Taking? Authorizing Provider  albuterol (PROVENTIL HFA;VENTOLIN HFA) 108 (90 Base) MCG/ACT inhaler Inhale 2 puffs into the lungs every 4 (four) hours as needed for wheezing or shortness of breath. Patient not taking: Reported on 01/13/2020 01/03/18   Cuthriell, Charline Bills, PA-C  etonogestrel (NEXPLANON) 68 MG IMPL implant 1 each by Subdermal route once.    [provider]  fluvoxaMINE (LUVOX) 100 MG tablet Take 1 tablet (100 mg total) by mouth at bedtime. Patient not taking: Reported on 11/07/2018 07/10/17   Pucilowska, Herma Ard B, MD  furosemide (LASIX) 20 MG tablet Take 1 tablet (20 mg total) by mouth daily. Patient not  taking: Reported on 11/07/2018 06/24/18 06/24/19  Sable Feil, PA-C  lidocaine (XYLOCAINE) 2 % solution Use as directed 5 mLs in the mouth or throat every 6 (six) hours as needed for mouth pain. For oral swish. Patient not taking: Reported on 11/07/2018 08/22/18   Sable Feil, PA-C  naproxen (NAPROSYN) 500 MG tablet Take 1 tablet (500 mg total) by mouth 2 (two) times daily with a meal. 03/17/20   Lavonia Drafts, MD  risperiDONE (RISPERDAL) 2 MG tablet Take 1 tablet (2 mg total) by mouth 2 (two) times daily. Patient not taking: Reported on 11/07/2018 07/10/17   Pucilowska, Wardell Honour, MD  traMADol (ULTRAM) 50 MG tablet Take 1 tablet (50 mg total) by mouth every 12 (twelve) hours as needed. Patient not taking: Reported on 11/07/2018 08/22/18   Sable Feil, PA-C  traZODone (DESYREL) 100 MG tablet Take 1 tablet (100 mg total) by mouth at bedtime. Patient not taking: Reported on 11/07/2018 07/10/17   Clovis Fredrickson, MD     Allergies Shellfish allergy  History reviewed. No pertinent family history.  Social History Social History   Tobacco Use  . Smoking status: Current Every Day Smoker  . Smokeless tobacco: Never Used  Substance Use Topics  . Alcohol use: Not Currently  . Drug use: No    Review of Systems  Constitutional: No fever/chills Eyes: No visual changes.  ENT: No sore throat. Cardiovascular: As above Respiratory:  Denies shortness of breath. Gastrointestinal: No abdominal pain.  Genitourinary: Negative for dysuria. Musculoskeletal: Negative for back pain. Skin: Negative for rash. Neurological: Negative for headaches   ____________________________________________   PHYSICAL EXAM:  VITAL SIGNS: ED Triage Vitals  Enc Vitals Group     BP 03/17/20 0702 108/71     Pulse Rate 03/17/20 0702 93     Resp 03/17/20 0702 (!) 22     Temp --      Temp src --      SpO2 03/17/20 0702 100 %     Weight 03/17/20 0703 (!) 140 kg (308 lb 10.3 oz)     Height 03/17/20 0703 1.575  m (5\' 2" )     Head Circumference --      Peak Flow --      Pain Score 03/17/20 0702 7     Pain Loc --      Pain Edu? --      Excl. in GC? --     Constitutional: Alert and oriented.   Nose: No congestion/rhinnorhea. Mouth/Throat: Mucous membranes are moist.   Cardiovascular: Normal rate, regular rhythm. Grossly normal heart sounds.  Good peripheral circulation.  Point tenderness just superior to the left breast which reproduces her pain, no rash or bruising Respiratory: Normal respiratory effort.  No retractions. Lungs CTAB. Gastrointestinal: Soft and nontender. No distention.  No CVA tenderness.  Musculoskeletal: No lower extremity tenderness nor edema.  Warm and well perfused Neurologic:  Normal speech and language. No gross focal neurologic deficits are appreciated.  Skin:  Skin is warm, dry and intact. No rash noted. Psychiatric: Mood and affect are normal. Speech and behavior are normal.  ____________________________________________   LABS (all labs ordered are listed, but only abnormal results are displayed)  Labs Reviewed  BASIC METABOLIC PANEL - Abnormal; Notable for the following components:      Result Value   Glucose, Bld 109 (*)    Calcium 8.6 (*)    All other components within normal limits  CBC  POCT PREGNANCY, URINE  TROPONIN I (HIGH SENSITIVITY)  TROPONIN I (HIGH SENSITIVITY)   ____________________________________________  EKG  ED ECG REPORT I, 03/19/20, the attending physician, personally viewed and interpreted this ECG.  Date: 03/17/2020  Rhythm: normal sinus rhythm QRS Axis: normal Intervals: normal ST/T Wave abnormalities: normal Narrative Interpretation: no evidence of acute ischemia  ____________________________________________  RADIOLOGY  Chest x-ray ____________________________________________   PROCEDURES  Procedure(s) performed: No  Procedures   Critical Care performed: No ____________________________________________    INITIAL IMPRESSION / ASSESSMENT AND PLAN / ED COURSE  Pertinent labs & imaging results that were available during my care of the patient were reviewed by me and considered in my medical decision making (see chart for details).  Patient presents with left anterior chest wall pain.  Vital signs are overall reassuring.  EKG is reassuring.  Suspect musculoskeletal chest wall pain however differential includes myocarditis/pericarditis, pneumonia.  Will check labs including troponin.  Chest x-ray to rule out pneumonia.  Pregnancy is negative, will give Toradol  Patient had 2 - troponins and after treatment had complete resolution of pain.  Suspect musculoskeletal pain.  Have prescribed naproxen, recommend outpatient follow-up, return precautions discussed    ____________________________________________   FINAL CLINICAL IMPRESSION(S) / ED DIAGNOSES  Final diagnoses:  Atypical chest pain        Note:  This document was prepared using Dragon voice recognition software and may include unintentional dictation errors.   03/19/2020, MD 03/17/20 1220

## 2020-03-17 NOTE — ED Triage Notes (Signed)
Patient arrives from home via EMS with left side chest pain that initially rated 9/10. It is reproducible with touch. EMS administered 324 ASA which brought pain down slightly

## 2020-03-17 NOTE — ED Notes (Signed)
Pt given juice

## 2020-05-21 ENCOUNTER — Other Ambulatory Visit: Payer: Self-pay

## 2020-05-21 ENCOUNTER — Emergency Department: Payer: Medicaid Other

## 2020-05-21 ENCOUNTER — Emergency Department
Admission: EM | Admit: 2020-05-21 | Discharge: 2020-05-21 | Disposition: A | Discharge status: Home / Self Care | Payer: Medicaid Other | Attending: Emergency Medicine | Admitting: Emergency Medicine

## 2020-05-21 ENCOUNTER — Encounter: Payer: Self-pay | Admitting: Emergency Medicine

## 2020-05-21 DIAGNOSIS — R079 Chest pain, unspecified: Secondary | ICD-10-CM

## 2020-05-21 DIAGNOSIS — R0789 Other chest pain: Secondary | ICD-10-CM | POA: Insufficient documentation

## 2020-05-21 DIAGNOSIS — R0602 Shortness of breath: Secondary | ICD-10-CM | POA: Diagnosis not present

## 2020-05-21 DIAGNOSIS — J45909 Unspecified asthma, uncomplicated: Secondary | ICD-10-CM | POA: Diagnosis not present

## 2020-05-21 LAB — CBC
HCT: 37.3 % (ref 36.0–46.0)
Hemoglobin: 11.9 g/dL — ABNORMAL LOW (ref 12.0–15.0)
MCH: 26.3 pg (ref 26.0–34.0)
MCHC: 31.9 g/dL (ref 30.0–36.0)
MCV: 82.5 fL (ref 80.0–100.0)
Platelets: 391 10*3/uL (ref 150–400)
RBC: 4.52 MIL/uL (ref 3.87–5.11)
RDW: 14.7 % (ref 11.5–15.5)
WBC: 9.8 10*3/uL (ref 4.0–10.5)
nRBC: 0 % (ref 0.0–0.2)

## 2020-05-21 LAB — TROPONIN I (HIGH SENSITIVITY): Troponin I (High Sensitivity): 5 ng/L (ref <18)

## 2020-05-21 LAB — BASIC METABOLIC PANEL
Anion gap: 8 (ref 5–15)
BUN: 12 mg/dL (ref 6–20)
CO2: 26 mmol/L (ref 22–32)
Calcium: 9.3 mg/dL (ref 8.9–10.3)
Chloride: 104 mmol/L (ref 98–111)
Creatinine, Ser: 1.06 mg/dL — ABNORMAL HIGH (ref 0.44–1.00)
GFR calc Af Amer: >60 mL/min (ref >60)
GFR calc non Af Amer: >60 mL/min (ref >60)
Glucose, Bld: 122 mg/dL — ABNORMAL HIGH (ref 70–99)
Potassium: 3.7 mmol/L (ref 3.5–5.1)
Sodium: 138 mmol/L (ref 135–145)

## 2020-05-21 LAB — BRAIN NATRIURETIC PEPTIDE: B Natriuretic Peptide: 40.9 pg/mL (ref 0.0–100.0)

## 2020-05-21 NOTE — ED Provider Notes (Signed)
ER Provider Note       Time seen: 9:27 AM    I have reviewed the vital signs and the nursing notes.  HISTORY   Chief Complaint Chest Pain    HPI Jacqueline Fletcher is a 29 y.o. female with a history of asthma, bipolar disorder, schizophrenia, sleep apnea who presents today for left-sided chest pain that started around 10 PM last night.  Patient states it hurt to breathe.  She denies nausea vomiting.  Discomfort was 7 out of 10.  Patient states she thinks she became upset because today is Father's Day and her father died last year.  Past Medical History:  Diagnosis Date  . Asthma   . Bipolar I disorder, current or most recent episode depressed, with psychotic features ([place])   . Schizophrenia ([place])   . Sleep apnea     Past Surgical History:  Procedure Laterality Date  . c-section    . TONSILLECTOMY      Allergies Shellfish allergy  Review of Systems Constitutional: Negative for fever. Cardiovascular: Positive for chest pain Respiratory: Negative for shortness of breath. Gastrointestinal: Negative for abdominal pain, vomiting and diarrhea. Musculoskeletal: Negative for back pain. Skin: Negative for rash. Neurological: Negative for headaches, focal weakness or numbness.  All systems negative/normal/unremarkable except as stated in the HPI  ____________________________________________   PHYSICAL EXAM:  VITAL SIGNS: Vitals:   05/21/20 0354  BP: 130/81  Pulse: 99  Resp: 18  Temp: 98.5 F (36.9 C)  SpO2: 100%    Constitutional: Alert and oriented. Well appearing and in no distress. Eyes: Conjunctivae are normal. Normal extraocular movements. ENT      Head: Normocephalic and atraumatic.      Nose: No congestion/rhinnorhea.      Mouth/Throat: Mucous membranes are moist.      Neck: No stridor. Cardiovascular: Normal rate, regular rhythm. No murmurs, rubs, or gallops. Respiratory: Normal respiratory effort without tachypnea nor retractions. Breath sounds are  clear and equal bilaterally. No wheezes/rales/rhonchi. Gastrointestinal: Soft and nontender. Normal bowel sounds Musculoskeletal: Nontender with normal range of motion in extremities. No lower extremity tenderness nor edema. Neurologic:  Normal speech and language. No gross focal neurologic deficits are appreciated.  Skin:  Skin is warm, dry and intact. No rash noted. Psychiatric: Speech and behavior are normal.  ____________________________________________  EKG: Interpreted by me.  Sinus tachycardia with a rate of 103 bpm, normal PR interval, normal QRS, normal QT  ____________________________________________   LABS (pertinent positives/negatives)  Labs Reviewed  BASIC METABOLIC PANEL - Abnormal; Notable for the following components:      Result Value   Glucose, Bld 122 (*)    Creatinine, Ser 1.06 (*)    All other components within normal limits  CBC - Abnormal; Notable for the following components:   Hemoglobin 11.9 (*)    All other components within normal limits  BRAIN NATRIURETIC PEPTIDE  POC URINE PREG, ED  TROPONIN I (HIGH SENSITIVITY)    RADIOLOGY  Images were viewed by me IMPRESSION: Central venous pulmonary congestion. Otherwise no acute cardiopulmonary findings.  DIFFERENTIAL DIAGNOSIS  Musculoskeletal pain, GERD, anxiety, PE, MI  ASSESSMENT AND PLAN  Chest pain, anxiety   Plan: The patient had presented for chest pain. Patient's labs were reassuring.  Patient symptoms likely resemble panic attack and her symptoms have resolved now.  She is not tachycardic, has no pleuritic pain or chest pain.  She is cleared for outpatient follow-up.  Lenise Arena MD    Note: This note was generated  in part or whole with voice recognition software. Voice recognition is usually quite accurate but there are transcription errors that can and very often do occur. I apologize for any typographical errors that were not detected and corrected.     Emily Filbert,  MD 05/21/20 [phone]

## 2020-05-21 NOTE — ED Notes (Signed)
Called lab to make sure they had pt down for redraw. Pt is on list, they will send someone when able

## 2020-05-21 NOTE — ED Triage Notes (Signed)
Patient with complaint of left side chest pain that started about 22:00 last night. Patient states that it hurts to breath. Denies nausea and vomiting. Patient states that she has had similar pain in the past and was told that it was muscular.

## 2020-05-21 NOTE — ED Notes (Signed)
Patient given a warm blanket. 

## 2021-03-22 ENCOUNTER — Other Ambulatory Visit: Payer: Self-pay

## 2021-03-22 ENCOUNTER — Emergency Department
Admission: EM | Admit: 2021-03-22 | Discharge: 2021-03-22 | Disposition: A | Discharge status: Home / Self Care | Payer: Medicaid Other | Attending: Emergency Medicine | Admitting: Emergency Medicine

## 2021-03-22 DIAGNOSIS — F17200 Nicotine dependence, unspecified, uncomplicated: Secondary | ICD-10-CM | POA: Diagnosis not present

## 2021-03-22 DIAGNOSIS — J45909 Unspecified asthma, uncomplicated: Secondary | ICD-10-CM | POA: Diagnosis not present

## 2021-03-22 DIAGNOSIS — M25512 Pain in left shoulder: Secondary | ICD-10-CM | POA: Diagnosis not present

## 2021-03-22 HISTORY — DX: Obesity, unspecified: E66.9

## 2021-03-22 MED ORDER — CYCLOBENZAPRINE HCL 5 MG PO TABS: 5.0000 mg | ORAL_TABLET | Freq: Three times a day (TID) | ORAL | 0 refills | Status: AC | PRN

## 2021-03-22 MED ORDER — KETOROLAC TROMETHAMINE 30 MG/ML IJ SOLN
30.0000 mg | Freq: Once | INTRAMUSCULAR | Status: AC
  Administered 2021-03-22: 30 mg via INTRAMUSCULAR
  Filled 2021-03-22: qty 1

## 2021-03-22 MED ORDER — IBUPROFEN 600 MG PO TABS: ORAL_TABLET | ORAL | 0 refills | Status: AC

## 2021-03-22 MED ORDER — CYCLOBENZAPRINE HCL 10 MG PO TABS
5.0000 mg | ORAL_TABLET | Freq: Once | ORAL | Status: AC
  Administered 2021-03-22: 5 mg via ORAL
  Filled 2021-03-22: qty 1

## 2021-03-22 NOTE — ED Provider Notes (Signed)
[facility] Emergency Department Provider Note  ____________________________________________   Event Date/Time   First MD Initiated Contact with Patient 03/22/21 [phone]     (approximate)  I have reviewed the triage vital signs and the nursing notes.   HISTORY  Chief Complaint Shoulder Pain    HPI Jacqueline Fletcher is a 30 y.o. female with medical and psychiatric history as listed below  who presents by EMS for evaluation of left shoulder pain.  She says she awoke at about 5 AM and had sharp pain starting at the top of her left shoulder and radiating down her arm but also radiating down the left side of her body to the top of her left leg.  She denies chest pain or shortness of breath.  She said that she has had similar pain in the past and was told it might be a pinched nerve.  She has no numbness nor tingling or weakness.  She does not think she was lying on her arm than usual way because she says she always sleeps on her right side.  No abdominal pain, no nausea, no vomiting.  She is having no neck pain.  No recent trauma.  No new medications recently, no changes to medications.        Past Medical History:  Diagnosis Date  . Asthma   . Bipolar I disorder, current or most recent episode depressed, with psychotic features ([facility])   . Obesity   . Schizophrenia ([facility])   . Sleep apnea     Patient Active Problem List   Diagnosis Date Noted  . Atrial fibrillation with RVR ([facility]) 04/19/2019  . Bipolar I disorder, current or most recent episode depressed, with psychotic features ([facility]) 07/07/2017    Past Surgical History:  Procedure Laterality Date  . c-section    . TONSILLECTOMY      Prior to Admission medications   Medication Sig Start Date End Date Taking? Authorizing Provider  cyclobenzaprine (FLEXERIL) 5 MG tablet Take 1 tablet (5 mg total) by mouth 3 (three) times daily as needed for muscle spasms (or pain). 03/22/21  Yes Loleta Rose, MD  ibuprofen  (ADVIL) 600 MG tablet Take 1 tablet by mouth three times daily with meals 03/22/21  Yes Loleta Rose, MD  albuterol (PROVENTIL HFA;VENTOLIN HFA) 108 (90 Base) MCG/ACT inhaler Inhale 2 puffs into the lungs every 4 (four) hours as needed for wheezing or shortness of breath. Patient not taking: Reported on 01/13/2020 01/03/18   Cuthriell, Delorise Royals, PA-C  etonogestrel (NEXPLANON) 68 MG IMPL implant 1 each by Subdermal route once.    [provider]  fluvoxaMINE (LUVOX) 100 MG tablet Take 1 tablet (100 mg total) by mouth at bedtime. Patient not taking: Reported on 11/07/2018 07/10/17   Pucilowska, Braulio Conte B, MD  furosemide (LASIX) 20 MG tablet Take 1 tablet (20 mg total) by mouth daily. Patient not taking: Reported on 11/07/2018 06/24/18 06/24/19  Joni Reining, PA-C  lidocaine (XYLOCAINE) 2 % solution Use as directed 5 mLs in the mouth or throat every 6 (six) hours as needed for mouth pain. For oral swish. Patient not taking: Reported on 11/07/2018 08/22/18   Joni Reining, PA-C  naproxen (NAPROSYN) 500 MG tablet Take 1 tablet (500 mg total) by mouth 2 (two) times daily with a meal. 03/17/20   Jene Every, MD  risperiDONE (RISPERDAL) 2 MG tablet Take 1 tablet (2 mg total) by mouth 2 (two) times daily. Patient not taking: Reported on 11/07/2018 07/10/17  Pucilowska, Jolanta B, MD  traMADol (ULTRAM) 50 MG tablet Take 1 tablet (50 mg total) by mouth every 12 (twelve) hours as needed. Patient not taking: Reported on 11/07/2018 08/22/18   Joni Reining, PA-C  traZODone (DESYREL) 100 MG tablet Take 1 tablet (100 mg total) by mouth at bedtime. Patient not taking: Reported on 11/07/2018 07/10/17   Shari Prows, MD    Allergies Shellfish allergy  History reviewed. No pertinent family history.  Social History Social History   Tobacco Use  . Smoking status: Current Every Day Smoker  . Smokeless tobacco: Never Used  Vaping Use  . Vaping Use: Never used  Substance Use Topics  . Alcohol use:  Not Currently  . Drug use: No    Review of Systems Constitutional: No fever/chills Eyes: No visual changes. ENT: No sore throat. Cardiovascular: Denies chest pain. Respiratory: Denies shortness of breath. Gastrointestinal: No abdominal pain.  No nausea, no vomiting.   Musculoskeletal: Pain in left shoulder radiating down the left side of her body. Integumentary: Negative for rash. Neurological: Negative for headaches, focal weakness or numbness.   ____________________________________________   PHYSICAL EXAM:  VITAL SIGNS: ED Triage Vitals  Enc Vitals Group     BP --      Pulse Rate 03/22/21 0539 98     Resp 03/22/21 0539 18     Temp 03/22/21 0539 98.3 F (36.8 C)     Temp Source 03/22/21 0539 Oral     SpO2 03/22/21 0539 100 %     Weight 03/22/21 0537 117.9 kg (260 lb)     Height 03/22/21 0537 1.575 m (5\' 2" )     Head Circumference --      Peak Flow --      Pain Score 03/22/21 0537 8     Pain Loc --      Pain Edu? --      Excl. in GC? --     Constitutional: Alert and oriented.  Very calm, no apparent distress. Eyes: Conjunctivae are normal.  Head: Atraumatic. Nose: No congestion/rhinnorhea. Mouth/Throat: Patient is wearing a mask. Neck: No stridor.  No meningeal signs.   Cardiovascular: Normal rate, regular rhythm. Good peripheral circulation. Respiratory: Normal respiratory effort.  No retractions. Gastrointestinal: Soft and nontender. No distention.  Musculoskeletal: No gross deformities of the left upper extremity.  Normal radial pulse.  Mild tenderness to palpation around the [facility] joint and down her upper arm, no tenderness after that.  Compartments are soft and easily compressible.  Patient reports reproducible pain with abduction and abduction of the shoulder. Neurologic:  Normal speech and language. No gross focal neurologic deficits are appreciated.  Skin:  Skin is warm, dry and intact. Psychiatric: Mood and affect are somewhat flat and blunted but  essentially normal, very polite and soft-spoken.  ____________________________________________   LABS (all labs ordered are listed, but only abnormal results are displayed)  Labs Reviewed - No data to display ____________________________________________  EKG  ED ECG REPORT I, [facility], the attending physician, personally viewed and interpreted this ECG.  Date: 03/22/2021 EKG Time: 5:41 AM Rate: 96 Rhythm: normal sinus rhythm QRS Axis: normal Intervals: normal ST/T Wave abnormalities: normal Narrative Interpretation: no evidence of acute ischemia  ____________________________________________    INITIAL IMPRESSION / MDM / ASSESSMENT AND PLAN / ED COURSE  As part of my medical decision making, I reviewed the following data within the electronic MEDICAL RECORD NUMBER Nursing notes reviewed and incorporated, Old chart reviewed, Notes from prior ED visits  and [place] Controlled Substance Database   Differential diagnosis includes, but is not limited to, cervical radiculopathy, nonspecific upper arm nerve compression, musculoskeletal strain, AC joint injury, bursitis.  More emergent or concerning causes such as ACS are very unlikely given the current presentation.  Vital signs are stable.  She woke up with pain in her left arm which is very isolated in spite of her report that it is radiating all the way down to her leg.  The tenderness seems to be isolated to the shoulder and she may be having some referred pain but it does not match an anticipated distribution.  She has no neck pain.  No recent trauma.  No indication for imaging.  An EKG was obtained which is nonischemic with no concerning abnormalities.  I reviewed the medical record extensively and see that she has had multiple visits for atypical chest pain somewhat similar to this issue even though she is not reporting any chest pain at this time, and she has also presented in the past during the night when she awoke with left arm pain  previously.  Her labs have always been within normal limits.  Given her very low risk of ACS and the nature of her symptoms, I think there is very limited benefit to obtaining lab work.  I provided Toradol 30 mg intramuscular and 1 Flexeril 5 mg p.o..  I reassessed her and she says she is feeling better; she still feels a little bit of discomfort in her shoulder but it has improved.  I am providing prescriptions as listed below and encouraged outpatient follow-up.  I gave my usual and customary return precautions.             ____________________________________________  FINAL CLINICAL IMPRESSION(S) / ED DIAGNOSES  Final diagnoses:  Acute pain of left shoulder     MEDICATIONS GIVEN DURING THIS VISIT:  Medications  cyclobenzaprine (FLEXERIL) tablet 5 mg (5 mg Oral Given 03/22/21 0545)  ketorolac (TORADOL) 30 MG/ML injection 30 mg (30 mg Intramuscular Given 03/22/21 0544)     ED Discharge Orders         Ordered    cyclobenzaprine (FLEXERIL) 5 MG tablet  3 times daily PRN        03/22/21 0645    ibuprofen (ADVIL) 600 MG tablet        03/22/21 0645          *Please note:  LANI HAVLIK was evaluated in Emergency Department on 03/22/2021 for the symptoms described in the history of present illness. She was evaluated in the context of the global COVID-19 pandemic, which necessitated consideration that the patient might be at risk for infection with the SARS-CoV-2 virus that causes COVID-19. Institutional protocols and algorithms that pertain to the evaluation of patients at risk for COVID-19 are in a state of rapid change based on information released by regulatory bodies including the CDC and federal and state organizations. These policies and algorithms were followed during the patient's care in the ED.  Some ED evaluations and interventions may be delayed as a result of limited staffing during and after the pandemic.*  Note:  This document was prepared using Dragon voice  recognition software and may include unintentional dictation errors.   Loleta Rose, MD 03/22/21 [phone]

## 2021-03-22 NOTE — ED Triage Notes (Signed)
Pt from home via [facility] with complaint of pain in front of left shoulder down entire left side of body. Began suddenly at 0500 this morning. Pain describes as shooting. Pt with hx of similar episode previously that was diagnosed as a pinched nerve in the neck.

## 2021-03-22 NOTE — Discharge Instructions (Signed)

## 2021-12-18 ENCOUNTER — Ambulatory Visit: Payer: Medicaid Other

## 2021-12-18 ENCOUNTER — Ambulatory Visit: Payer: Self-pay

## 2023-04-30 ENCOUNTER — Emergency Department: Payer: Medicaid Other

## 2023-04-30 ENCOUNTER — Emergency Department
Admission: EM | Admit: 2023-04-30 | Discharge: 2023-04-30 | Disposition: A | Discharge status: Home / Self Care | Payer: Medicaid Other | Attending: Emergency Medicine | Admitting: Emergency Medicine

## 2023-04-30 ENCOUNTER — Other Ambulatory Visit: Payer: Self-pay

## 2023-04-30 DIAGNOSIS — R55 Syncope and collapse: Secondary | ICD-10-CM | POA: Insufficient documentation

## 2023-04-30 DIAGNOSIS — R000 Tachycardia, unspecified: Secondary | ICD-10-CM | POA: Diagnosis not present

## 2023-04-30 DIAGNOSIS — R42 Dizziness and giddiness: Secondary | ICD-10-CM

## 2023-04-30 DIAGNOSIS — E860 Dehydration: Secondary | ICD-10-CM

## 2023-04-30 LAB — CBC WITH DIFFERENTIAL/PLATELET
Abs Immature Granulocytes: 0.02 10*3/uL (ref 0.00–0.07)
Basophils Absolute: 0 10*3/uL (ref 0.0–0.1)
Basophils Relative: 0 %
Eosinophils Absolute: 0 10*3/uL (ref 0.0–0.5)
Eosinophils Relative: 1 %
HCT: 38.8 % (ref 36.0–46.0)
Hemoglobin: 12.4 g/dL (ref 12.0–15.0)
Immature Granulocytes: 0 %
Lymphocytes Relative: 16 %
Lymphs Abs: 0.9 10*3/uL (ref 0.7–4.0)
MCH: 26.6 pg (ref 26.0–34.0)
MCHC: 32 g/dL (ref 30.0–36.0)
MCV: 83.1 fL (ref 80.0–100.0)
Monocytes Absolute: 0.6 10*3/uL (ref 0.1–1.0)
Monocytes Relative: 10 %
Neutro Abs: 4.1 10*3/uL (ref 1.7–7.7)
Neutrophils Relative %: 73 %
Platelets: 323 10*3/uL (ref 150–400)
RBC: 4.67 MIL/uL (ref 3.87–5.11)
RDW: 14.6 % (ref 11.5–15.5)
WBC: 5.7 10*3/uL (ref 4.0–10.5)
nRBC: 0 % (ref 0.0–0.2)

## 2023-04-30 LAB — COMPREHENSIVE METABOLIC PANEL
ALT: 20 U/L (ref 0–44)
AST: 33 U/L (ref 15–41)
Albumin: 3.9 g/dL (ref 3.5–5.0)
Alkaline Phosphatase: 76 U/L (ref 38–126)
Anion gap: 5 (ref 5–15)
BUN: 12 mg/dL (ref 6–20)
CO2: 23 mmol/L (ref 22–32)
Calcium: 8.4 mg/dL — ABNORMAL LOW (ref 8.9–10.3)
Chloride: 103 mmol/L (ref 98–111)
Creatinine, Ser: 1.13 mg/dL — ABNORMAL HIGH (ref 0.44–1.00)
GFR, Estimated: >60 mL/min (ref >60)
Glucose, Bld: 105 mg/dL — ABNORMAL HIGH (ref 70–99)
Potassium: 3.4 mmol/L — ABNORMAL LOW (ref 3.5–5.1)
Sodium: 131 mmol/L — ABNORMAL LOW (ref 135–145)
Total Bilirubin: 0.6 mg/dL (ref 0.3–1.2)
Total Protein: 7.9 g/dL (ref 6.5–8.1)

## 2023-04-30 LAB — URINALYSIS, W/ REFLEX TO CULTURE (INFECTION SUSPECTED)
Bilirubin Urine: NEGATIVE
Glucose, UA: NEGATIVE mg/dL
Ketones, ur: NEGATIVE mg/dL
Leukocytes,Ua: NEGATIVE
Nitrite: NEGATIVE
Protein, ur: NEGATIVE mg/dL
Specific Gravity, Urine: 1.021 (ref 1.005–1.030)
pH: 6 (ref 5.0–8.0)

## 2023-04-30 LAB — TSH: TSH: 3.407 u[IU]/mL (ref 0.350–4.500)

## 2023-04-30 LAB — MAGNESIUM: Magnesium: 1.9 mg/dL (ref 1.7–2.4)

## 2023-04-30 LAB — PREGNANCY, URINE: Preg Test, Ur: NEGATIVE

## 2023-04-30 LAB — TROPONIN I (HIGH SENSITIVITY): Troponin I (High Sensitivity): 4 ng/L (ref <18)

## 2023-04-30 MED ORDER — ACETAMINOPHEN 500 MG PO TABS
1000.0000 mg | ORAL_TABLET | Freq: Once | ORAL | Status: AC
  Administered 2023-04-30: 1000 mg via ORAL
  Filled 2023-04-30: qty 2

## 2023-04-30 MED ORDER — SODIUM CHLORIDE 0.9 % IV BOLUS
500.0000 mL | Freq: Once | INTRAVENOUS | Status: AC
  Administered 2023-04-30: 500 mL via INTRAVENOUS

## 2023-04-30 MED ORDER — SODIUM CHLORIDE 0.9 % IV BOLUS
1000.0000 mL | Freq: Once | INTRAVENOUS | Status: AC
  Administered 2023-04-30: 1000 mL via INTRAVENOUS

## 2023-04-30 NOTE — ED Provider Notes (Signed)
[facility] Provider Note    Event Date/Time   First MD Initiated Contact with Patient 04/30/23 2053     (approximate)   History   Dizziness   HPI  Jacqueline Fletcher is a 32 y.o. female with no significant past medical history who presents to the emergency department with dizziness.  Patient states that tonight she was getting out of the shower and started to feel very lightheaded and dizzy and almost passed out.  Did not completely lose consciousness or hit her head.  Caught her self on both of her hands.  Denies any pain from the fall.  States that over the past couple of days she has been having some dizziness and lightheadedness that is worse upon standing.  Does complain of a couple of days of headache.  Complaining of pain to the left back of her head.  Denies any change in vision.  Does endorse nausea but no episodes of vomiting.  Denies any significant cough or shortness of breath.  No chest pain.  Denies any dysuria, urinary urgency or frequency.  Denies any neck pain or neck stiffness.  Denies any concern for pregnancy.  Implanon in place.     Physical Exam   Triage Vital Signs: ED Triage Vitals  Enc Vitals Group     BP --      Pulse --      Resp --      Temp --      Temp src --      SpO2 --      Weight 04/30/23 2053 (!) 388 lb 3.2 oz (176.1 kg)     Height 04/30/23 2052 5\' 2"  (1.575 m)     Head Circumference --      Peak Flow --      Pain Score 04/30/23 2052 7     Pain Loc --      Pain Edu? --      Excl. in GC? --     Most recent vital signs: Vitals:   04/30/23 2055 04/30/23 2215  BP: 113/65 (!) 107/52  Pulse: (!) 125 (!) 116  Resp: 13 (!) 24  Temp: 100.2 F (37.9 C)   SpO2: 100% 100%    Physical Exam Constitutional:      Appearance: She is well-developed.  HENT:     Head: Atraumatic.  Eyes:     Conjunctiva/sclera: Conjunctivae normal.     Pupils: Pupils are equal, round, and reactive to light.  Cardiovascular:     Rate  and Rhythm: Regular rhythm. Tachycardia present.  Pulmonary:     Effort: No respiratory distress.  Abdominal:     General: There is no distension.  Musculoskeletal:        General: Normal range of motion.     Cervical back: Normal range of motion and neck supple. No tenderness.  Skin:    General: Skin is warm.     Capillary Refill: Capillary refill takes less than 2 seconds.  Neurological:     Mental Status: She is alert. Mental status is at baseline.     GCS: GCS eye subscore is 4. GCS verbal subscore is 5. GCS motor subscore is 6.     Cranial Nerves: Cranial nerves 2-12 are intact.     Motor: Motor function is intact.     Coordination: Coordination is intact.     Gait: Gait is intact.  Psychiatric:        Mood and Affect: Mood normal.  IMPRESSION / MDM / ASSESSMENT AND PLAN / ED COURSE  I reviewed the triage vital signs and the nursing notes.  Differential diagnosis including ACS, dysrhythmia, dehydration, electrolyte abnormality, pneumonia, urinary tract infection, migraine headache  On arrival low-grade fever, tachycardic.  EKG  I, Corena Herter, the attending physician, personally viewed and interpreted this ECG.  Sinus tachycardia with a heart rate of 125.  Normal intervals.  QTc 430.  No significant ST elevation or depressions.  T wave inverted to lead II and aVF.  No signs of acute ischemia or dysrhythmia.  Sinus tachycardia while on cardiac telemetry.  RADIOLOGY I independently reviewed imaging, my interpretation of imaging: CT scan of the head without signs of intracranial hemorrhage or infarction.  Chest x-ray with no signs of pneumonia.  LABS (all labs ordered are listed, but only abnormal results are displayed) Labs interpreted as -    Labs Reviewed  COMPREHENSIVE METABOLIC PANEL - Abnormal; Notable for the following components:      Result Value   Sodium 131 (*)    Potassium 3.4 (*)    Glucose, Bld 105 (*)    Creatinine, Ser 1.13 (*)    Calcium  8.4 (*)    All other components within normal limits  URINALYSIS, W/ REFLEX TO CULTURE (INFECTION SUSPECTED) - Abnormal; Notable for the following components:   Color, Urine YELLOW (*)    APPearance HAZY (*)    Hgb urine dipstick LARGE (*)    Bacteria, UA RARE (*)    All other components within normal limits  CBC WITH DIFFERENTIAL/PLATELET  MAGNESIUM  TSH  PREGNANCY, URINE  TROPONIN I (HIGH SENSITIVITY)     MDM    Patient given 1.5 L of IV fluids.  Given Tylenol  Patient without obvious source of an infectious process.  No significant electrolyte abnormalities.  Creatinine appears to be at her baseline.  Pregnancy test is negative.  Low risk heart score, troponin is negative, have low suspicion for ACS or dysrhythmia.  On reevaluation no longer with a headache.  Again has no signs of meningismus.  Able to ambulate to the bathroom but does have persistent tachycardia.  Given another liter of IV fluids and will obtain orthostatic blood pressures.  If reassuring after IV fluids will discharge home with close follow-up with her primary care physician.  Orthostatic blood pressures are negative.  Heart rate improved to 100.  Given a prescription for Zofran.  Discussed increasing oral hydration.  Discussed close follow-up with primary care provider.   PROCEDURES:  Critical Care performed: No  Procedures  Patient's presentation is most consistent with acute presentation with potential threat to life or bodily function.   MEDICATIONS ORDERED IN ED: Medications  sodium chloride 0.9 % bolus 500 mL (0 mLs Intravenous Stopped 04/30/23 2253)  acetaminophen (TYLENOL) tablet 1,000 mg (1,000 mg Oral Given 04/30/23 2128)  sodium chloride 0.9 % bolus 1,000 mL (1,000 mLs Intravenous New Bag/Given 04/30/23 2253)    FINAL CLINICAL IMPRESSION(S) / ED DIAGNOSES   Final diagnoses:  Near syncope  Dizziness     Rx / DC Orders   ED Discharge Orders     None        Note:  This document  was prepared using Dragon voice recognition software and may include unintentional dictation errors.   Corena Herter, MD 04/30/23 2316

## 2023-04-30 NOTE — ED Notes (Signed)
Pt given meal tray at this time 

## 2023-04-30 NOTE — ED Triage Notes (Signed)
Pt arrives EMS from home with reports of onset of dizziness just prior to arrival. Pt reports she was going to get in the shower and got dizzy and had to fall to hands and knees. Pt denies any injury and denies hitting head. Pt reports a couple days of migraine headaches.

## 2023-04-30 NOTE — ED Notes (Signed)
Pts family member at bedside, denies needs.

## 2023-04-30 NOTE — Discharge Instructions (Signed)
You were seen in the emergency department after almost passing out.  You are found to be dehydrated in the emergency department.  Concern that you have a viral illness that is causing your symptoms.  It is importantly stay hydrated and drink plenty of fluids.  You can alternate Motrin and Tylenol for pain control.  Follow-up closely with your primary care physician.  Return to the emergency department for any worsening symptoms.
